# Patient Record
Sex: Female | Born: 1990
Health system: Southern US, Community
[De-identification: ages and names within clinical notes are randomized; demographics above are authoritative.]

## PROBLEM LIST (undated history)

## (undated) DIAGNOSIS — R51 Headache: Secondary | ICD-10-CM

## (undated) DIAGNOSIS — R519 Headache, unspecified: Secondary | ICD-10-CM

## (undated) DIAGNOSIS — Z8742 Personal history of other diseases of the female genital tract: Secondary | ICD-10-CM

## (undated) DIAGNOSIS — R002 Palpitations: Secondary | ICD-10-CM

## (undated) DIAGNOSIS — G5761 Lesion of plantar nerve, right lower limb: Secondary | ICD-10-CM

## (undated) DIAGNOSIS — Z309 Encounter for contraceptive management, unspecified: Secondary | ICD-10-CM

## (undated) HISTORY — DX: Personal history of other diseases of the female genital tract: Z87.42

## (undated) HISTORY — DX: Palpitations: R00.2

## (undated) HISTORY — PX: WISDOM TOOTH EXTRACTION: SHX21

## (undated) HISTORY — DX: Encounter for contraceptive management, unspecified: Z30.9

---
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
## 2012-12-16 HISTORY — PX: BREAST SURGERY: SHX581

## 2015-05-26 ENCOUNTER — Emergency Department
Admission: EM | Admit: 2015-05-26 | Discharge: 2015-05-26 | Discharge status: Absent without leave | Payer: 59 | Source: Home / Self Care

## 2015-05-26 ENCOUNTER — Encounter (HOSPITAL_BASED_OUTPATIENT_CLINIC_OR_DEPARTMENT_OTHER): Payer: Self-pay | Admitting: *Deleted

## 2015-05-26 ENCOUNTER — Emergency Department (HOSPITAL_BASED_OUTPATIENT_CLINIC_OR_DEPARTMENT_OTHER): Payer: 59

## 2015-05-26 ENCOUNTER — Emergency Department (HOSPITAL_BASED_OUTPATIENT_CLINIC_OR_DEPARTMENT_OTHER)
Admission: EM | Admit: 2015-05-26 | Discharge: 2015-05-26 | Disposition: A | Discharge status: Home / Self Care | Payer: 59 | Attending: Emergency Medicine | Admitting: Emergency Medicine

## 2015-05-26 DIAGNOSIS — Z3202 Encounter for pregnancy test, result negative: Secondary | ICD-10-CM | POA: Diagnosis not present

## 2015-05-26 DIAGNOSIS — R1031 Right lower quadrant pain: Secondary | ICD-10-CM

## 2015-05-26 DIAGNOSIS — R102 Pelvic and perineal pain: Secondary | ICD-10-CM

## 2015-05-26 DIAGNOSIS — Z8742 Personal history of other diseases of the female genital tract: Secondary | ICD-10-CM | POA: Diagnosis not present

## 2015-05-26 DIAGNOSIS — Z87442 Personal history of urinary calculi: Secondary | ICD-10-CM | POA: Diagnosis not present

## 2015-05-26 DIAGNOSIS — R109 Unspecified abdominal pain: Secondary | ICD-10-CM | POA: Diagnosis present

## 2015-05-26 LAB — URINALYSIS, ROUTINE W REFLEX MICROSCOPIC
Bilirubin Urine: NEGATIVE
GLUCOSE, UA: NEGATIVE mg/dL
Hgb urine dipstick: NEGATIVE
Ketones, ur: NEGATIVE mg/dL
Leukocytes, UA: NEGATIVE
NITRITE: NEGATIVE
PH: 6 (ref 5.0–8.0)
Protein, ur: NEGATIVE mg/dL
Specific Gravity, Urine: 1.005 (ref 1.005–1.030)
UROBILINOGEN UA: 0.2 mg/dL (ref 0.0–1.0)

## 2015-05-26 LAB — PREGNANCY, URINE: PREG TEST UR: NEGATIVE

## 2015-05-26 NOTE — ED Notes (Signed)
Pt remains in [place] dept at this time

## 2015-05-26 NOTE — ED Notes (Signed)
Pt c/o right lower abd pain x 3 days.  Nausea only

## 2015-05-26 NOTE — Discharge Instructions (Signed)
Motrin 600 mg every 6 hours as needed for pain.  Return to the emergency department if you develop worsening pain, high fever, bloody stool, or other new and concerning symptoms.   Abdominal Pain, Women Abdominal (stomach, pelvic, or belly) pain can be caused by many things. It is important to tell your doctor:  The location of the pain.  Does it come and go or is it present all the time?  Are there things that start the pain (eating certain foods, exercise)?  Are there other symptoms associated with the pain (fever, nausea, vomiting, diarrhea)? All of this is helpful to know when trying to find the cause of the pain. CAUSES   Stomach: virus or bacteria infection, or ulcer.  Intestine: appendicitis (inflamed appendix), regional ileitis (Crohn's disease), ulcerative colitis (inflamed colon), irritable bowel syndrome, diverticulitis (inflamed diverticulum of the colon), or cancer of the stomach or intestine.  Gallbladder disease or stones in the gallbladder.  Kidney disease, kidney stones, or infection.  Pancreas infection or cancer.  Fibromyalgia (pain disorder).  Diseases of the female organs:  Uterus: fibroid (non-cancerous) tumors or infection.  Fallopian tubes: infection or tubal pregnancy.  Ovary: cysts or tumors.  Pelvic adhesions (scar tissue).  Endometriosis (uterus lining tissue growing in the pelvis and on the pelvic organs).  Pelvic congestion syndrome (female organs filling up with blood just before the menstrual period).  Pain with the menstrual period.  Pain with ovulation (producing an egg).  Pain with an IUD (intrauterine device, birth control) in the uterus.  Cancer of the female organs.  Functional pain (pain not caused by a disease, may improve without treatment).  Psychological pain.  Depression. DIAGNOSIS  Your doctor will decide the seriousness of your pain by doing an examination.  Blood tests.  X-rays.  Ultrasound.  CT scan  (computed tomography, special type of X-ray).  MRI (magnetic resonance imaging).  Cultures, for infection.  Barium enema (dye inserted in the large intestine, to better view it with X-rays).  Colonoscopy (looking in intestine with a lighted tube).  Laparoscopy (minor surgery, looking in abdomen with a lighted tube).  Major abdominal exploratory surgery (looking in abdomen with a large incision). TREATMENT  The treatment will depend on the cause of the pain.   Many cases can be observed and treated at home.  Over-the-counter medicines recommended by your caregiver.  Prescription medicine.  Antibiotics, for infection.  Birth control pills, for painful periods or for ovulation pain.  Hormone treatment, for endometriosis.  Nerve blocking injections.  Physical therapy.  Antidepressants.  Counseling with a psychologist or psychiatrist.  Minor or major surgery. HOME CARE INSTRUCTIONS   Do not take laxatives, unless directed by your caregiver.  Take over-the-counter pain medicine only if ordered by your caregiver. Do not take aspirin because it can cause an upset stomach or bleeding.  Try a clear liquid diet (broth or water) as ordered by your caregiver. Slowly move to a bland diet, as tolerated, if the pain is related to the stomach or intestine.  Have a thermometer and take your temperature several times a day, and record it.  Bed rest and sleep, if it helps the pain.  Avoid sexual intercourse, if it causes pain.  Avoid stressful situations.  Keep your follow-up appointments and tests, as your caregiver orders.  If the pain does not go away with medicine or surgery, you may try:  Acupuncture.  Relaxation exercises (yoga, meditation).  Group therapy.  Counseling. SEEK MEDICAL CARE IF:   You  notice certain foods cause stomach pain.  Your home care treatment is not helping your pain.  You need stronger pain medicine.  You want your IUD removed.  You  feel faint or lightheaded.  You develop nausea and vomiting.  You develop a rash.  You are having side effects or an allergy to your medicine. SEEK IMMEDIATE MEDICAL CARE IF:   Your pain does not go away or gets worse.  You have a fever.  Your pain is felt only in portions of the abdomen. The right side could possibly be appendicitis. The left lower portion of the abdomen could be colitis or diverticulitis.  You are passing blood in your stools (bright red or black tarry stools, with or without vomiting).  You have blood in your urine.  You develop chills, with or without a fever.  You pass out. MAKE SURE YOU:   Understand these instructions.  Will watch your condition.  Will get help right away if you are not doing well or get worse. Document Released: 08/29/2007 Document Revised: 03/18/2014 Document Reviewed: 09/18/2009 [facility] Patient Information 2015 [place], [place]. This information is not intended to replace advice given to you by your health care provider. Make sure you discuss any questions you have with your health care provider.

## 2015-05-26 NOTE — ED Provider Notes (Signed)
CSN: [id number]     Arrival date & time 05/26/15  6294 History  This chart was scribed for Kelsey Speak, MD by Helane Gunther, ED Scribe. This patient was seen in room MH03/MH03 and the patient's care was started at 8:49 PM.    Chief Complaint  Patient presents with  . Abdominal Pain   Patient is a 24 y.o. female presenting with abdominal pain. The history is provided by the patient. No language interpreter was used.  Abdominal Pain Pain location:  RLQ Pain quality: sharp   Pain radiates to:  Does not radiate Pain severity:  Moderate Onset quality:  Sudden Duration:  3 days Timing:  Intermittent Progression:  Worsening Chronicity:  Recurrent Context: not trauma   Relieved by:  Nothing Worsened by:  Nothing tried Ineffective treatments:  None tried Associated symptoms: no constipation, no dysuria, no fever, no vaginal bleeding and no vaginal discharge    HPI Comments: Kelsey Brooks is a 24 y.o. female who presents to the Emergency Department complaining of intermittent, sharp, abdominal pain in her RLQ onset 3 days ago. She has a PMHx of ovarian cysts. Pt has seen her OBGYN about 2 months ago, where she underwent an internal [place], which did not show anything. She usually experinces pain in her right ovary every 2 months, but states that  this episode is much worse. She has not had a normal period in a year. She was on BCP but stopped a few weeks ago as recommended by her OBGYN. She has a PSHx of appendectomy. She denies PMHx of kidney stones. She denies trauma, injury, or falls. She also denies fever, dysuria, vaginal discharge, and constipation.  History reviewed. No pertinent past medical history. Past Surgical History  Procedure Laterality Date  . Appendectomy     History reviewed. No pertinent family history. History  Substance Use Topics  . Smoking status: Never Smoker   . Smokeless tobacco: Not on file  . Alcohol Use: No   OB History    No data available     Review of  Systems  Constitutional: Negative for fever.  Gastrointestinal: Positive for abdominal pain. Negative for constipation.  Genitourinary: Negative for dysuria, vaginal bleeding and vaginal discharge.  All other systems reviewed and are negative.     Allergies  Review of patient's allergies indicates not on file.  Home Medications   Prior to Admission medications   Not on File   BP 133/99 mmHg  Pulse 68  Temp(Src) 98.2 F (36.8 C)  Resp 16  Ht 5\' 8"  (1.727 m)  Wt 160 lb (72.576 kg)  BMI 24.33 kg/m2  SpO2 100%  LMP 05/25/2014 Physical Exam  Constitutional: She is oriented to person, place, and time. She appears well-developed and well-nourished. No distress.  HENT:  Head: Normocephalic and atraumatic.  Mouth/Throat: Oropharynx is clear and moist.  Eyes: Conjunctivae and EOM are normal. Pupils are equal, round, and reactive to light.  Neck: Normal range of motion. Neck supple. No tracheal deviation present.  Cardiovascular: Normal rate.   Pulmonary/Chest: Breath sounds normal. No respiratory distress.  Abdominal: Soft. There is tenderness. There is no rebound and no guarding.  TTP in RLQ  Musculoskeletal: Normal range of motion.  Neurological: She is alert and oriented to person, place, and time.  Skin: Skin is warm and dry.  Psychiatric: She has a normal mood and affect. Her behavior is normal.  Nursing note and vitals reviewed.   ED Course  Procedures  DIAGNOSTIC STUDIES: Oxygen Saturation is  100% on RA, normal by my interpretation.    COORDINATION OF CARE: 8:54 PM - Discussed diagnostic study results. Discussed plans to perform an [place]. Pt advised of plan for treatment and pt agrees.  Labs Review Labs Reviewed  PREGNANCY, URINE  URINALYSIS, ROUTINE W REFLEX MICROSCOPIC (NOT AT [facility])    Imaging Review No results found.   EKG Interpretation None      MDM   Final diagnoses:  None    Patient is a 24 year old female with history of what sounds like  dysmenorrhea and painful menstrual periods. She has been having these issues for several years. She began with right lower abdominal pain 3 days ago which feels similar to her previous episodes, however is somewhat worse today. She denies any fevers or chills. She denies any urinary complaints. She also has had a prior appendectomy several years ago.  Her physical examination reveals mild tenderness in the right lower quadrant with no rebound and no guarding. Her pregnancy test is negative, urinalysis is clear, and ultrasound of this area reveals no evidence for torsion or ovarian cyst. Patient denies any vaginal discharge or abnormal bleeding and declines a pelvic examination as she recently had a GYN exam 2 months ago. She will be discharged with instructions to take Motrin as needed and return to the ER if symptoms significantly worsen or change.  I personally performed the services described in this documentation, which was scribed in my presence. The recorded information has been reviewed and is accurate.      Kelsey Speak, MD 05/26/15 2236

## 2015-10-20 ENCOUNTER — Encounter: Payer: Self-pay | Admitting: Osteopathic Medicine

## 2015-10-20 ENCOUNTER — Ambulatory Visit (INDEPENDENT_AMBULATORY_CARE_PROVIDER_SITE_OTHER): Payer: 59 | Admitting: Osteopathic Medicine

## 2015-10-20 VITALS — BP 124/77 | HR 63 | Ht 68.0 in | Wt 161.0 lb

## 2015-10-20 DIAGNOSIS — Z309 Encounter for contraceptive management, unspecified: Secondary | ICD-10-CM

## 2015-10-20 DIAGNOSIS — Z8249 Family history of ischemic heart disease and other diseases of the circulatory system: Secondary | ICD-10-CM | POA: Diagnosis not present

## 2015-10-20 DIAGNOSIS — Z308 Encounter for other contraceptive management: Secondary | ICD-10-CM | POA: Diagnosis not present

## 2015-10-20 DIAGNOSIS — R002 Palpitations: Secondary | ICD-10-CM

## 2015-10-20 DIAGNOSIS — Z833 Family history of diabetes mellitus: Secondary | ICD-10-CM

## 2015-10-20 HISTORY — DX: Encounter for contraceptive management, unspecified: Z30.9

## 2015-10-20 HISTORY — DX: Palpitations: R00.2

## 2015-10-20 LAB — POCT URINE PREGNANCY: Preg Test, Ur: NEGATIVE

## 2015-10-20 MED ORDER — NORETHINDRONE-ETH ESTRADIOL 1-35 MG-MCG PO TABS: 1.0000 | ORAL_TABLET | Freq: Every day | ORAL | Status: DC

## 2015-10-20 NOTE — Patient Instructions (Signed)
You have been given an order sheet for lab work to get done to further evaluate palpitations and possible high cholesterol given your family history. We should have these results back in one or 2 days after your blood is drawn, please call us if you don't hear anything. You should also be getting a call from cardiology office about scheduling a Holter monitor placement to further evaluate for palpitations, please let us know if you don't hear about setting this up. If her palpitations get worse, if you develop chest pain, you have trouble breathing or loss of consciousness this would be a reason to go to the emergency room right away. Dr. Redgie Grayer office will call you with results of blood work, follow-up to be decided based on results. Otherwise come back in one year for annual physical/refill birth control. Come back sooner if any concerns.

## 2015-10-20 NOTE — Progress Notes (Signed)
HPI: Kelsey Brooks is a 24 y.o. female who presents to [place]  today for chief complaint of:  Chief Complaint  Patient presents with  . Establish Care    refill OCP, discuss heart palpitations   CONTRACEPTION   On OCP, doing well  IRREGULAR HEARTBEAT . Quality: "flutters:  . Severity: mild . Duration: onset maybe 2 years ago happening once every 6 months or so but now more frequent . Timing: has these about 3 times per week, random, lasts about 5 seconds or so,  . Context: (+)FH heart problems including grandparents with CVA and MI, father has irregular heartbeat, so did grandmother (grandmother needed cardiac surgery), ages 54's typically  . Modifying factors: . Assoc signs/symptoms: occasional SOB but recovers quickly.  No chest pain. Has been dizzy, no LOC   FEMALE PREVENTIVE CARE  ANNUAL SCREENING/COUNSELING Tobacco - Never  Alcohol - social drinker Diet/Exercise - HEALTHY HABITS DISCUSSED TO DECREASE CV RISK Sexual Health - Yes with female. STI - The patient denies history of sexually transmitted disease. INTERESTED IN STI TESTING - no Depression - PQH2 Negative Domestic violence concerns - no HTN SCREENING - SEE VITALS Vaccination status - SEE BELOW  INFECTIOUS DISEASE SCREENING HIV - all adults 15-65 - does not need GC/CT - sexually active - does not need TB - if risk/required by employer - does not need  DISEASE SCREENING Lipid - (Low risk screen M35/F45; High risk screen M25/F35 if HTN, Tob, FH CHD M<55/F<65) - needs DM2 (45+ or Risk = FH 1st deg DM, Hx GDM, overweight/sedentary, high-risk ethnicity, HTN) - needs  CANCER SCREENING Cervical - Pap q3 yr age 44+, Pap + HPV q5y age 19+ - PAP - does not need - had 94 and was normal  Breast - Mammo age 82+ (C) and biennial age 54-75 (A) - 65 - does not need Colon - age 17+ or 25 years of age prior to [place] Dx - GI REFERRAL - does not need  ADULT VACCINATION Influenza -  annual - was offered and declined by the patient Td booster every 10 years - was not indicated HPV - age <69yo - already has, 2 of the 3  Pneumonia - age 94+ sooner if risk (DM, smoker, other) - was not indicated   Past medical, social and family history reviewed: Past Medical History  Diagnosis Date  . Contraception management 10/20/2015    OCP   Past Surgical History  Procedure Laterality Date  . Appendectomy  2004  . Breast surgery  12/2012    BREAT AGUMENTATION   Social History  Substance Use Topics  . Smoking status: Never Smoker   . Smokeless tobacco: Not on file  . Alcohol Use: No   History reviewed. No pertinent family history.  Current Outpatient Prescriptions  Medication Sig Dispense Refill  . norethindrone-ethinyl estradiol 1/35 (ORTHO-NOVUM, NORTREL,CYCLAFEM) tablet Take 1 tablet by mouth daily.     No current facility-administered medications for this visit.   No Known Allergies    Review of Systems: CONSTITUTIONAL:  No  fever, no chills, No  unintentional weight changes HEAD/EYES/EARS/NOSE/THROAT: No headache, no vision change, no hearing change, No  sore throat CARDIAC: No chest pain, no pressure/palpitations, no orthopnea RESPIRATORY: No  cough, No  shortness of breath/wheeze GASTROINTESTINAL: No nausea, no vomiting, no abdominal pain, no blood in stool, no diarrhea, no constipation MUSCULOSKELETAL: No  myalgia/arthralgia GENITOURINARY: No incontinence, No abnormal genital bleeding/discharge SKIN: No rash/wounds/concerning lesions HEM/ONC: No easy bruising/bleeding, no  abnormal lymph node ENDOCRINE: No polyuria/polydipsia/polyphagia, no heat/cold intolerance  NEUROLOGIC: No weakness, no dizziness, no slurred speech PSYCHIATRIC: No concerns with depression, no concerns with anxiety, no sleep problems    Exam:  BP 124/77 mmHg  Pulse 63  Ht 5\' 8"  (1.727 m)  Wt 161 lb (73.029 kg)  BMI 24.49 kg/m2 Constitutional: VSS, see above. General Appearance:  alert, well-developed, well-nourished, NAD Eyes: Normal lids and conjunctive, non-icteric sclera, PERRLA Ears, Nose, Mouth, Throat: Normal external inspection ears/nares/mouth/lips/gums, TM normal bilaterally, MMM, posterior pharynx No  erythema No  exudate Neck: No masses, trachea midline. No thyroid enlargement/tenderness/mass appreciated. No lymphadenopathy Respiratory: Normal respiratory effort. no wheeze, no rhonchi, no rales Cardiovascular: S1/S2 normal, no murmur, no rub/gallop auscultated. RRR.  No carotid bruit or JVD. No abdominal aortic bruit.  Pedal pulse II/IV bilaterally DP and PT.  No lower extremity edema. Gastrointestinal: Nontender, no masses. No hepatomegaly, no splenomegaly. No hernia appreciated. Bowel sounds normal. Rectal exam deferred.  Musculoskeletal: Gait normal. No clubbing/cyanosis of digits.  Neurological: No cranial nerve deficit on limited exam. Motor and sensation intact and symmetric Psychiatric: Normal judgment/insight. Normal mood and affect. Oriented x3.    Results for orders placed or performed in visit on 10/20/15 (from the past 72 hour(s))  POCT urine pregnancy     Status: None   Collection Time: 10/20/15  8:43 AM  Result Value Ref Range   Preg Test, Ur Negative Negative    EKG interpretation: Rate: 63 Rhythm: sinus Axis: normal No ST/T changes concerning for acute ischemia/infarct  Normal PR interval, no Delta wave, no J wave ?RBBB V1, V2   ASSESSMENT/PLAN: W/u for palpitations given abnormal E will KG, increased frequency. Refill OCP. Screening as below, recommend find immunization records re Tetanus and HPV, repeat Pap due 2018. ER precautions reviewed, the patient instructions.   Palpitations - Plan: CBC with Differential/Platelet, COMPLETE METABOLIC PANEL WITH GFR, TSH, Magnesium, Holter monitor - 48 hour  Encounter for other contraceptive management - Plan: POCT urine pregnancy  Family history of heart disease - Plan: Lipid  panel  Family history of diabetes mellitus   Return TBD based on results, as needed for any concerns.

## 2015-10-21 LAB — CBC WITH DIFFERENTIAL/PLATELET
Basophils Absolute: 0 10*3/uL (ref 0.0–0.1)
Basophils Relative: 0 % (ref 0–1)
Eosinophils Absolute: 0 10*3/uL (ref 0.0–0.7)
Eosinophils Relative: 1 % (ref 0–5)
HEMATOCRIT: 41.8 % (ref 36.0–46.0)
HEMOGLOBIN: 14.3 g/dL (ref 12.0–15.0)
LYMPHS ABS: 2.1 10*3/uL (ref 0.7–4.0)
Lymphocytes Relative: 43 % (ref 12–46)
MCH: 30.7 pg (ref 26.0–34.0)
MCHC: 34.2 g/dL (ref 30.0–36.0)
MCV: 89.7 fL (ref 78.0–100.0)
MONOS PCT: 6 % (ref 3–12)
MPV: 10.3 fL (ref 8.6–12.4)
Monocytes Absolute: 0.3 10*3/uL (ref 0.1–1.0)
NEUTROS PCT: 50 % (ref 43–77)
Neutro Abs: 2.5 10*3/uL (ref 1.7–7.7)
Platelets: 229 10*3/uL (ref 150–400)
RBC: 4.66 MIL/uL (ref 3.87–5.11)
RDW: 12.9 % (ref 11.5–15.5)
WBC: 4.9 10*3/uL (ref 4.0–10.5)

## 2015-10-21 LAB — COMPLETE METABOLIC PANEL WITH GFR
ALBUMIN: 4 g/dL (ref 3.6–5.1)
ALK PHOS: 48 U/L (ref 33–115)
ALT: 11 U/L (ref 6–29)
AST: 17 U/L (ref 10–30)
BUN: 13 mg/dL (ref 7–25)
CO2: 26 mmol/L (ref 20–31)
Calcium: 8.5 mg/dL — ABNORMAL LOW (ref 8.6–10.2)
Chloride: 104 mmol/L (ref 98–110)
Creat: 0.9 mg/dL (ref 0.50–1.10)
GFR, Est African American: >89 mL/min (ref >=60)
GFR, Est Non African American: 89 mL/min (ref >=60)
Glucose, Bld: 81 mg/dL (ref 65–99)
POTASSIUM: 3.8 mmol/L (ref 3.5–5.3)
Sodium: 139 mmol/L (ref 135–146)
TOTAL PROTEIN: 6.4 g/dL (ref 6.1–8.1)
Total Bilirubin: 0.7 mg/dL (ref 0.2–1.2)

## 2015-10-21 LAB — LIPID PANEL
CHOL/HDL RATIO: 2.5 ratio (ref <=5.0)
CHOLESTEROL: 164 mg/dL (ref 125–200)
HDL: 66 mg/dL (ref >=46)
LDL Cholesterol: 82 mg/dL (ref <130)
Triglycerides: 81 mg/dL (ref <150)
VLDL: 16 mg/dL (ref <30)

## 2015-10-21 LAB — MAGNESIUM: Magnesium: 2 mg/dL (ref 1.5–2.5)

## 2015-10-21 LAB — TSH: TSH: 2.741 u[IU]/mL (ref 0.350–4.500)

## 2015-10-22 ENCOUNTER — Other Ambulatory Visit: Payer: Self-pay

## 2015-10-22 DIAGNOSIS — R002 Palpitations: Secondary | ICD-10-CM

## 2015-10-23 NOTE — Addendum Note (Signed)
Addended by: Huel Cote on: 10/23/2015 04:06 PM   Modules accepted: Orders

## 2015-10-24 ENCOUNTER — Encounter: Payer: Self-pay | Admitting: Osteopathic Medicine

## 2015-10-24 DIAGNOSIS — Z8742 Personal history of other diseases of the female genital tract: Secondary | ICD-10-CM

## 2015-10-24 HISTORY — DX: Personal history of other diseases of the female genital tract: Z87.42

## 2015-10-31 ENCOUNTER — Ambulatory Visit (INDEPENDENT_AMBULATORY_CARE_PROVIDER_SITE_OTHER): Payer: 59

## 2015-10-31 DIAGNOSIS — R002 Palpitations: Secondary | ICD-10-CM

## 2016-01-26 ENCOUNTER — Telehealth: Payer: Self-pay

## 2016-01-26 DIAGNOSIS — Z3041 Encounter for surveillance of contraceptive pills: Secondary | ICD-10-CM

## 2016-01-26 MED ORDER — NORETHINDRONE ACET-ETHINYL EST 1-20 MG-MCG PO TABS: 1.0000 | ORAL_TABLET | Freq: Every day | ORAL | Status: DC

## 2016-01-26 NOTE — Telephone Encounter (Signed)
Patient called stated that the pharmacy no longer carry the  norethindrone-ethinyl estradiol 1/35 (ORTHO-NOVUM, NORTREL,CYCLAFEM) tablet   1 tablet, Daily 12 ordered  ReorderDiscontinue      . She would like to know if there is something else you can send to her pharmacy. Please advise that patient is out of the medication. Zakhi Dupre,CMA

## 2016-01-27 NOTE — Telephone Encounter (Signed)
Left detailed message on patient vm advising that alternative birth control was sent to pharmacy. Rhonda Cunningham,CMA

## 2016-01-27 NOTE — Telephone Encounter (Signed)
Sent alternative

## 2016-03-26 ENCOUNTER — Encounter: Payer: Self-pay | Admitting: Emergency Medicine

## 2016-03-26 ENCOUNTER — Emergency Department
Admission: EM | Admit: 2016-03-26 | Discharge: 2016-03-26 | Disposition: A | Discharge status: Home / Self Care | Payer: 59 | Source: Home / Self Care | Attending: Emergency Medicine | Admitting: Emergency Medicine

## 2016-03-26 DIAGNOSIS — J029 Acute pharyngitis, unspecified: Secondary | ICD-10-CM | POA: Diagnosis not present

## 2016-03-26 LAB — POCT RAPID STREP A (OFFICE): Rapid Strep A Screen: NEGATIVE

## 2016-03-26 MED ORDER — AMOXICILLIN 500 MG PO CAPS: 500.0000 mg | ORAL_CAPSULE | Freq: Two times a day (BID) | ORAL | Status: DC

## 2016-03-26 NOTE — ED Notes (Signed)
Sore throat, fatigue x 1 week

## 2016-03-26 NOTE — ED Provider Notes (Signed)
CSN: [id number]     Arrival date & time 03/26/16  [id number] History   First MD Initiated Contact with Patient 03/26/16 [phone]     Chief Complaint  Patient presents with  . Sore Throat    HPI SORE THROAT Onset: 6 days    Severity: moderate-Severe Tried OTC meds without significant relief. She is leaving for 1 week cruise tonight and requests antibiotic treatment if possible  Symptoms:  May have had low-grade fever, she is not sure though   No Recent Strep Exposure     No Myalgias No Headache No Rash  No Discolored Nasal Mucus Mild seasonal Allergy symptoms, minimal clear postnasal drainage. Mild to moderate watery itchy eyes at times. No sinus pain/pressure No earache  No Drooling No Trismus  No Nausea No Vomiting No Abdominal pain No Diarrhea No Reflux symptoms  No Cough No Breathing Difficulty No Shortness of Breath No pleuritic pain No Wheezing No Hemoptysis  She denies chance of pregnancy Past Medical History  Diagnosis Date  . Contraception management 10/20/2015    OCP  . Palpitations 10/20/2015  . History of ovarian cyst 10/24/2015   Past Surgical History  Procedure Laterality Date  . Appendectomy  2004  . Breast surgery  12/2012    BREAT AGUMENTATION   History reviewed. No pertinent family history. Social History  Substance Use Topics  . Smoking status: Never Smoker   . Smokeless tobacco: None  . Alcohol Use: No   OB History    No data available     Review of Systems  All other systems reviewed and are negative.  see history of present illness  Allergies  Review of patient's allergies indicates no known allergies.  Home Medications   Prior to Admission medications   Medication Sig Start Date End Date Taking? Authorizing Provider  amoxicillin (AMOXIL) 500 MG capsule Take 1 capsule (500 mg total) by mouth 2 (two) times daily. 03/26/16   Jacqulyn Cane, MD  norethindrone-ethinyl estradiol (MICROGESTIN,JUNEL,LOESTRIN) 1-20 MG-MCG tablet Take 1  tablet by mouth daily. 01/26/16   Emeterio Reeve, DO   Meds Ordered and Administered this Visit  Medications - No data to display  BP 120/85 mmHg  Pulse 67  Temp(Src) 97.9 F (36.6 C) (Oral)  Ht 5\' 8"  (1.727 m)  Wt 163 lb (73.936 kg)  BMI 24.79 kg/m2  SpO2 100%  LMP 03/15/2016 No data found.   Physical Exam  Constitutional: She is oriented to person, place, and time. She appears well-developed and well-nourished. She is cooperative.  Non-toxic appearance. No distress.  HENT:  Head: Normocephalic and atraumatic.  Right Ear: Tympanic membrane, external ear and ear canal normal.  Left Ear: Tympanic membrane, external ear and ear canal normal.  Nose: Right sinus exhibits no maxillary sinus tenderness and no frontal sinus tenderness. Left sinus exhibits no maxillary sinus tenderness and no frontal sinus tenderness.  Mouth/Throat: Mucous membranes are normal. Posterior oropharyngeal erythema present. No oropharyngeal exudate or posterior oropharyngeal edema.  Nose: Minimally boggy turbinates, minimal serous drainage. Pharynx: Very injected without tonsillar enlargement or exudate. Minimal cobblestoning posterior pharynx  Eyes: Conjunctivae are normal. No scleral icterus.  Neck: Neck supple.  Neck supple, minimal shotty, nontender anterior cervical nodes. No posterior cervical nodes  Cardiovascular: Normal rate, regular rhythm and normal heart sounds.   No murmur heard. Pulmonary/Chest: Effort normal and breath sounds normal. No stridor. No respiratory distress. She has no wheezes. She has no rales.  Abdominal: She exhibits no distension.  Musculoskeletal: She exhibits no  edema.  Lymphadenopathy:    She has cervical adenopathy.       Right cervical: Superficial cervical adenopathy present. No deep cervical and no posterior cervical adenopathy present.      Left cervical: Superficial cervical adenopathy present. No deep cervical and no posterior cervical adenopathy present.   Neurological: She is alert and oriented to person, place, and time.  Skin: Skin is warm and dry. No rash noted.  Psychiatric: She has a normal mood and affect.  Nursing note and vitals reviewed.   ED Course  Procedures (including critical care time)  Labs Review Labs Reviewed  POCT RAPID STREP A (OFFICE)    Imaging Review No results found.   Visual Acuity Review  Right Eye Distance:   Left Eye Distance:   Bilateral Distance:    Right Eye Near:   Left Eye Near:    Bilateral Near:         MDM Rapid strep test negative. She declined sending off strep culture.  I explained it's possible she could have a false negative rapid strep test, but it more likely is viral pharyngitis and/or seasonal allergies as a cause for her symptoms.    1. Acute pharyngitis, unspecified etiology    Risks, benefits, alternatives discussed. She prefers filling the antibiotic and taking it with her on the cruise-leaving for the cruise this evening for 1 week. She'll take the antibiotic if worsening symptoms or fever. amoxicillin (AMOXIL) 500 MG capsule Take 1 capsule (500 mg total) by mouth 2 (two) times daily. 20 capsule   Follow-up with your primary care doctor in 7 days if not improving, or sooner if symptoms become worse. Precautions discussed. Red flags discussed. Questions invited and answered. Patient voiced understanding and agreement.     Jacqulyn Cane, MD 03/26/16 [phone]

## 2016-12-23 DIAGNOSIS — H5213 Myopia, bilateral: Secondary | ICD-10-CM | POA: Diagnosis not present

## 2016-12-23 DIAGNOSIS — H527 Unspecified disorder of refraction: Secondary | ICD-10-CM | POA: Diagnosis not present

## 2017-01-12 ENCOUNTER — Ambulatory Visit (INDEPENDENT_AMBULATORY_CARE_PROVIDER_SITE_OTHER): Payer: 59 | Admitting: Osteopathic Medicine

## 2017-01-12 ENCOUNTER — Ambulatory Visit (INDEPENDENT_AMBULATORY_CARE_PROVIDER_SITE_OTHER): Payer: 59

## 2017-01-12 ENCOUNTER — Telehealth: Payer: Self-pay | Admitting: Osteopathic Medicine

## 2017-01-12 ENCOUNTER — Encounter: Payer: Self-pay | Admitting: Osteopathic Medicine

## 2017-01-12 VITALS — BP 128/83 | HR 73 | Ht 68.0 in | Wt 169.0 lb

## 2017-01-12 DIAGNOSIS — M79671 Pain in right foot: Secondary | ICD-10-CM

## 2017-01-12 DIAGNOSIS — G8929 Other chronic pain: Secondary | ICD-10-CM | POA: Diagnosis not present

## 2017-01-12 DIAGNOSIS — Z3041 Encounter for surveillance of contraceptive pills: Secondary | ICD-10-CM | POA: Diagnosis not present

## 2017-01-12 DIAGNOSIS — M7751 Other enthesopathy of right foot: Secondary | ICD-10-CM

## 2017-01-12 DIAGNOSIS — M7741 Metatarsalgia, right foot: Secondary | ICD-10-CM

## 2017-01-12 MED ORDER — NORETHINDRONE ACET-ETHINYL EST 1-20 MG-MCG PO TABS: 1.0000 | ORAL_TABLET | Freq: Every day | ORAL | 11 refills | Status: DC

## 2017-01-12 MED ORDER — MELOXICAM 7.5 MG PO TABS: 7.5000 mg | ORAL_TABLET | Freq: Every day | ORAL | 2 refills | Status: DC

## 2017-01-12 NOTE — Telephone Encounter (Signed)
Please call patient: No concerns seen on the x-ray

## 2017-01-12 NOTE — Progress Notes (Signed)
HPI: Kelsey Brooks is a 26 y.o. female  who presents to [facility] today, 01/12/17,  for chief complaint of:  Chief Complaint  Patient presents with  . Follow-up    BIRTH CONTROL  . Toe Pain    RIGHT FOOT 2ND TOE    Refill birth control: no plans for pregnancy anytime soon but possibly in the next 1-2 years  Foot pain . Context: dancer and teaches dance . Location: R 2nd toe at the base (MTP/PIP) . Quality: sore, gets swollen and occasionally bruises . Duration: ongoing for few years but getting more frequent . Timing: once or twice per month, lasts 6-8 hours when it happens . Modifying factors: resolves with rest and ice . Assoc signs/symptoms: no numbness or fall  Palpitations: infrequent now, occur with stress, pt not concerned about needing further w/u    Past medical history, surgical history, social history and family history reviewed.  Patient Active Problem List   Diagnosis Date Noted  . History of ovarian cyst 10/24/2015  . Contraception management 10/20/2015  . Palpitations 10/20/2015    Current medication list and allergy/intolerance information reviewed.   Current Outpatient Prescriptions on File Prior to Visit  Medication Sig Dispense Refill  . norethindrone-ethinyl estradiol (MICROGESTIN,JUNEL,LOESTRIN) 1-20 MG-MCG tablet Take 1 tablet by mouth daily. 1 Package 11   No current facility-administered medications on file prior to visit.    No Known Allergies    Review of Systems:  Constitutional: No recent illness  HEENT: No  headache, no vision change  Cardiac: No  chest pain, No  pressure, +occasional stress-related palpitations  Respiratory:  No  shortness of breath. No  Cough  Musculoskeletal: + myalgia/arthralgia  Skin: No  Rash  Psychiatric: No  concerns with depression, No  concerns with anxiety  Exam:  BP 128/83   Pulse 73   Ht 5\' 8"  (1.727 m)   Wt 169 lb (76.7 kg)   BMI 25.70 kg/m    Constitutional: VS see above. General Appearance: alert, well-developed, well-nourished, NAD  Eyes: Normal lids and conjunctive, non-icteric sclera  Ears, Nose, Mouth, Throat: MMM, Normal external inspection ears/nares/mouth/lips/gums.  Neck: No masses, trachea midline.   Respiratory: Normal respiratory effort. no wheeze, no rhonchi, no rales  Cardiovascular: S1/S2 normal, no murmur, no rub/gallop auscultated. RRR.   Musculoskeletal: Gait normal. Symmetric and independent movement of all extremities. R 2nd toe deviated medially at the DIP, no swelling/eccymoses, normal passive ROM and nontender   Neurological: Normal balance/coordination. No tremor.  Skin: warm, dry, intact.   Psychiatric: Normal judgment/insight. Normal mood and affect. Oriented x3.    Dg Foot Complete Right  Result Date: 01/12/2017 CLINICAL DATA:  Chronic right foot pain without known injury. EXAM: RIGHT FOOT COMPLETE - 3+ VIEW COMPARISON:  None. FINDINGS: There is no evidence of fracture or dislocation. There is no evidence of arthropathy or other focal bone abnormality. Soft tissues are unremarkable. IMPRESSION: Normal right foot. Electronically Signed   By: Marijo Conception, M.D.   On: 01/12/2017 09:17   XR personally reviewed     ASSESSMENT/PLAN:   Encounter for surveillance of contraceptive pills - Plan: norethindrone-ethinyl estradiol (MICROGESTIN,JUNEL,LOESTRIN) 1-20 MG-MCG tablet  Metatarsalgia of right foot - Plan: meloxicam (MOBIC) 7.5 MG tablet, DG Foot Complete Right  Tendinitis of right foot - Plan: DG Foot Complete Right    Patient Instructions  Plan:  When foot is bothering you, rest, ice, elevation, antiinflammatories  If continues or gets worse, recommend referral to  podiatry or sports medicine in this office (Dr. Georgina Snell or Dr. Darene Lamer)     Follow-up plan: Return for foot pain as needed.  Visit summary with medication list and pertinent instructions was printed for patient to review,  alert [place] if any changes needed. All questions at time of visit were answered - patient instructed to contact office with any additional concerns. ER/RTC precautions were reviewed with the patient and understanding verbalized.

## 2017-01-12 NOTE — Patient Instructions (Signed)
Plan:  When foot is bothering you, rest, ice, elevation, antiinflammatories  If continues or gets worse, recommend referral to podiatry or sports medicine in this office (Dr. Georgina Snell or Dr. Darene Lamer)

## 2017-01-13 NOTE — Telephone Encounter (Signed)
Patient has been informed. Zaydrian Batta,CMA  

## 2017-02-15 ENCOUNTER — Encounter: Payer: Self-pay | Admitting: *Deleted

## 2017-02-15 ENCOUNTER — Emergency Department (INDEPENDENT_AMBULATORY_CARE_PROVIDER_SITE_OTHER)
Admission: EM | Admit: 2017-02-15 | Discharge: 2017-02-15 | Disposition: A | Discharge status: Home / Self Care | Payer: 59 | Source: Home / Self Care | Attending: Family Medicine | Admitting: Family Medicine

## 2017-02-15 DIAGNOSIS — J019 Acute sinusitis, unspecified: Secondary | ICD-10-CM

## 2017-02-15 DIAGNOSIS — B9689 Other specified bacterial agents as the cause of diseases classified elsewhere: Secondary | ICD-10-CM

## 2017-02-15 DIAGNOSIS — J069 Acute upper respiratory infection, unspecified: Secondary | ICD-10-CM

## 2017-02-15 MED ORDER — BENZONATATE 100 MG PO CAPS: 100.0000 mg | ORAL_CAPSULE | Freq: Three times a day (TID) | ORAL | 0 refills | Status: DC

## 2017-02-15 MED ORDER — AMOXICILLIN-POT CLAVULANATE 875-125 MG PO TABS
1.0000 | ORAL_TABLET | Freq: Two times a day (BID) | ORAL | 0 refills | Status: DC
Start: 2017-02-15 — End: 2017-05-25

## 2017-02-15 NOTE — ED Triage Notes (Signed)
Patient c/o 6 days of productive cough, sore throat, hoarse,  Then later chills and hot flashes, HA. Taken Mucinex cough, Sudafed and IBF.

## 2017-02-15 NOTE — ED Provider Notes (Signed)
CSN: [id number]     Arrival date & time 02/15/17  1054 History   First MD Initiated Contact with Patient 02/15/17 1114     Chief Complaint  Patient presents with  . Cough  . Sore Throat   (Consider location/radiation/quality/duration/timing/severity/associated sxs/prior Treatment) HPI  Elianys Conry is a 26 y.o. female presenting to [facility] with c/o  6 days of worsening productive cough, sore throat, hoarse voice, and frontal headache with sinus pain and pressure.  She also reports intermittent chills and hot flashes.  She has taken Mucinex, Sudafed and ibuprofen with mild temporary relief.   Past Medical History:  Diagnosis Date  . Contraception management 10/20/2015   OCP  . History of ovarian cyst 10/24/2015  . Palpitations 10/20/2015   Past Surgical History:  Procedure Laterality Date  . APPENDECTOMY  2004  . BREAST SURGERY  12/2012   BREAT AGUMENTATION   History reviewed. No pertinent family history. Social History  Substance Use Topics  . Smoking status: Never Smoker  . Smokeless tobacco: Never Used  . Alcohol use No   OB History    No data available     Review of Systems  Constitutional: Positive for chills and diaphoresis. Negative for fever.  HENT: Positive for congestion, ear pain, rhinorrhea, sinus pain, sinus pressure, sore throat and voice change. Negative for trouble swallowing.   Respiratory: Positive for cough. Negative for shortness of breath.   Cardiovascular: Negative for chest pain and palpitations.  Gastrointestinal: Negative for abdominal pain, diarrhea, nausea and vomiting.  Musculoskeletal: Negative for arthralgias, back pain and myalgias.  Skin: Negative for rash.  Neurological: Positive for headaches. Negative for dizziness and light-headedness.    Allergies  Patient has no known allergies.  Home Medications   Prior to Admission medications   Medication Sig Start Date End Date Taking? Authorizing Provider  amoxicillin-clavulanate (AUGMENTIN)  875-125 MG tablet Take 1 tablet by mouth 2 (two) times daily. One po bid x 7 days 02/15/17   Noland Fordyce, PA-C  benzonatate (TESSALON) 100 MG capsule Take 1-2 capsules (100-200 mg total) by mouth every 8 (eight) hours. 02/15/17   Noland Fordyce, PA-C  meloxicam (MOBIC) 7.5 MG tablet Take 1-2 tablets (7.5-15 mg total) by mouth daily. 01/12/17   Emeterio Reeve, DO  norethindrone-ethinyl estradiol (MICROGESTIN,JUNEL,LOESTRIN) 1-20 MG-MCG tablet Take 1 tablet by mouth daily. 01/12/17   Emeterio Reeve, DO   Meds Ordered and Administered this Visit  Medications - No data to display  BP 133/87 (BP Location: Left Arm)   Pulse 99   Temp 97.6 F (36.4 C) (Oral)   Resp 14   Wt 166 lb (75.3 kg)   LMP 01/15/2017 (Approximate)   SpO2 98%   BMI 25.24 kg/m  No data found.   Physical Exam  Constitutional: She is oriented to person, place, and time. She appears well-developed and well-nourished. No distress.  HENT:  Head: Normocephalic and atraumatic.  Right Ear: Tympanic membrane normal.  Left Ear: Tympanic membrane normal.  Nose: Mucosal edema present. Right sinus exhibits maxillary sinus tenderness and frontal sinus tenderness. Left sinus exhibits maxillary sinus tenderness and frontal sinus tenderness.  Mouth/Throat: Uvula is midline, oropharynx is clear and moist and mucous membranes are normal.  Eyes: EOM are normal.  Neck: Normal range of motion. Neck supple.  Cardiovascular: Normal rate and regular rhythm.   Pulmonary/Chest: Effort normal and breath sounds normal. No stridor. No respiratory distress. She has no wheezes. She has no rales.  Musculoskeletal: Normal range of motion.  Lymphadenopathy:  She has no cervical adenopathy.  Neurological: She is alert and oriented to person, place, and time.  Skin: Skin is warm and dry. She is not diaphoretic.  Psychiatric: She has a normal mood and affect. Her behavior is normal.  Nursing note and vitals reviewed.   Urgent Care Course      Procedures (including critical care time)  Labs Review Labs Reviewed - No data to display  Imaging Review No results found.  MDM   1. Acute bacterial sinusitis   2. Upper respiratory tract infection, unspecified type    Hx and exam c/w bacterial sinusitis and URI  Rx: Augmentin and tessalon May continue mucinex, acetaminophen and ibuprofen.  f/u with PCP in 1 week if not improving.    Noland Fordyce, PA-C 02/15/17 1149

## 2017-03-09 ENCOUNTER — Telehealth: Payer: Self-pay

## 2017-03-09 DIAGNOSIS — M79673 Pain in unspecified foot: Secondary | ICD-10-CM

## 2017-03-09 NOTE — Telephone Encounter (Signed)
Patient called stated that she is having foot issues and she request a referral for a Podiatrist. Please advise. Rhonda Cunningham,CMA

## 2017-03-09 NOTE — Telephone Encounter (Signed)
Message left on vm 

## 2017-03-09 NOTE — Telephone Encounter (Signed)
Please call patient: Referral has been ordered.

## 2017-03-14 ENCOUNTER — Ambulatory Visit (INDEPENDENT_AMBULATORY_CARE_PROVIDER_SITE_OTHER): Payer: 59 | Admitting: Podiatry

## 2017-03-14 DIAGNOSIS — G5761 Lesion of plantar nerve, right lower limb: Secondary | ICD-10-CM | POA: Diagnosis not present

## 2017-03-14 DIAGNOSIS — M21969 Unspecified acquired deformity of unspecified lower leg: Secondary | ICD-10-CM

## 2017-03-14 DIAGNOSIS — M2010 Hallux valgus (acquired), unspecified foot: Secondary | ICD-10-CM | POA: Diagnosis not present

## 2017-03-14 NOTE — Patient Instructions (Signed)
Seen for pain in right foot. Possible Neuroma at 2nd intermetatarsal space right. Also has Hallux valgus with bunion deformity bilateral. Metatarsal binder x 2 dispensed. Reviewed possible surgical option. Need custom orthotics.

## 2017-03-14 NOTE — Progress Notes (Signed)
SUBJECTIVE: 26 y.o. year old female presents complaining of pain in right foot over 3 years. Noted of popping toe with bruise and pain weekly.  REVIEW OF SYSTEMS: Pertinent items noted in HPI and remainder of comprehensive ROS otherwise negative.  OBJECTIVE: DERMATOLOGIC EXAMINATION: No abnormal skin lesions.  VASCULAR EXAMINATION OF LOWER LIMBS: All pedal pulses are palpable with normal pulsation.  Capillary Filling times within 3 seconds in all digits.  No edema or erythema noted. Temperature gradient from tibial crest to dorsum of foot is within normal bilateral.  NEUROLOGIC EXAMINATION OF THE LOWER LIMBS: All epicritic and tactile sensations grossly intact. Sharp and Dull discriminatory sensations at the plantar ball of hallux is intact bilateral.   MUSCULOSKELETAL EXAMINATION: Positive for pain under 2nd intermetatarsal space with direct and lateral pressure. Occasional feeling of popping with pain. Excess bunion deformity with hypermobility of the first ray bilateral.  ASSESSMENT: Morton's neuroma 2nd interspace right. HAV with bunion bilateral. Hypermobile first ray bilateral.  PLAN: Reviewed findings and available treatment options, injection, orthotics, surgical. Metatarsal binder x 1 pair large dispensed to add stability of the first ray. Patient will return for Orthotics or for pre op.

## 2017-03-15 ENCOUNTER — Encounter: Payer: Self-pay | Admitting: Podiatry

## 2017-03-18 ENCOUNTER — Encounter (INDEPENDENT_AMBULATORY_CARE_PROVIDER_SITE_OTHER): Payer: Self-pay | Admitting: Orthopedic Surgery

## 2017-03-18 ENCOUNTER — Ambulatory Visit (INDEPENDENT_AMBULATORY_CARE_PROVIDER_SITE_OTHER): Payer: 59 | Admitting: Orthopedic Surgery

## 2017-03-18 VITALS — Ht 68.0 in | Wt 166.0 lb

## 2017-03-18 DIAGNOSIS — G5761 Lesion of plantar nerve, right lower limb: Secondary | ICD-10-CM | POA: Diagnosis not present

## 2017-03-18 MED ORDER — LIDOCAINE HCL 1 % IJ SOLN
1.0000 mL | INTRAMUSCULAR | Status: AC | PRN
  Administered 2017-03-18: 1 mL

## 2017-03-18 MED ORDER — METHYLPREDNISOLONE ACETATE 40 MG/ML IJ SUSP
40.0000 mg | INTRAMUSCULAR | Status: AC | PRN
  Administered 2017-03-18: 40 mg via INTRA_ARTICULAR

## 2017-03-18 NOTE — Progress Notes (Signed)
Office Visit Note   Patient: Kelsey Brooks           Date of Birth: January 18, 1991           MRN: [id number] Visit Date: 03/18/2017              Requested by: Emeterio Reeve, DO [place] [address] [place],  [id number] PCP: Emeterio Reeve, DO  Chief Complaint  Patient presents with  . Right Foot - Pain      HPI: Patient is a 26 year old woman who has had a painful Morton's neuroma for the past 3 years. Patient to use as she did see a podiatrist recently who recommended no injections but just removal of neuroma. 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 She states she has popping in the third webspace with running and burning radicular pain radiating up her foot.  Assessment & Plan: Visit Diagnoses:  1. Morton neuroma, right     Plan: After informed consent the Morton seroma was injected. She tolerated this well follow-up as needed. Discussed that we could proceed with up to 3 shots. Discussed that she still has mechanical popping in the third webspace that a resection would be the best option. Recommended wider running shoes.  Follow-Up Instructions: Return if symptoms worsen or fail to improve.   Ortho Exam  Patient is alert, oriented, no adenopathy, well-dressed, normal affect, normal respiratory effort. Patient has a normal gait. She has good pulses good ankle and subtalar motion. She has no tenderness to palpation over the metatarsal heads she is point tender to palpation of the third web space. Lateral compression of the metatarsal heads reproduces a clunk.  Imaging: No results found.  Labs: No results found for: HGBA1C, ESRSEDRATE, CRP, LABURIC, REPTSTATUS, GRAMSTAIN, CULT, LABORGA  Orders:  No orders of the defined types were placed in this encounter.  No orders of the defined types were placed in this encounter.    Procedures: Small Joint Inj Date/Time: 03/18/2017 9:18 AM Performed by: DUDA, MARCUS V Authorized by: Newt Minion   Consent Given by:  Patient Site marked: the procedure site was marked   Timeout: prior to procedure the correct patient, procedure, and site was verified   Indications:  Pain and diagnostic evaluation Location:  Foot Site:  R intertarsal Prep: patient was prepped and draped in usual sterile fashion   Needle Size:  22 G Spinal Needle: No   Approach:  Dorsal Ultrasound Guided: No   Fluoroscopic Guidance: No   Medications:  1 mL lidocaine 1 %; 40 mg methylPREDNISolone acetate 40 MG/ML Aspiration Attempted: No   Patient tolerance:  Patient tolerated the procedure well with no immediate complications    Clinical Data: No additional findings.  ROS:  All other systems negative, except as noted in the HPI. Review of Systems  Objective: Vital Signs: Ht 5\' 8"  (1.727 m)   Wt 166 lb (75.3 kg)   BMI 25.24 kg/m   Specialty Comments:  No specialty comments available.  PMFS History: Patient Active Problem List   Diagnosis Date Noted  . Morton neuroma, right 03/18/2017  . Metatarsalgia of right foot 01/12/2017  . History of ovarian cyst 10/24/2015  . Contraception management 10/20/2015  . Palpitations 10/20/2015   Past Medical History:  Diagnosis Date  . Contraception management 10/20/2015   OCP  . History of ovarian cyst 10/24/2015  . Palpitations 10/20/2015    No family history on file.  Past Surgical History:  Procedure Laterality Date  . APPENDECTOMY  2004  . BREAST SURGERY  12/2012   BREAT AGUMENTATION   Social History   Occupational History  . Not on file.   Social History Main Topics  . Smoking status: Never Smoker  . Smokeless tobacco: Never Used  . Alcohol use No  . Drug use: No  . Sexual activity: No

## 2017-05-12 ENCOUNTER — Telehealth (INDEPENDENT_AMBULATORY_CARE_PROVIDER_SITE_OTHER): Payer: Self-pay | Admitting: Orthopedic Surgery

## 2017-05-12 NOTE — Telephone Encounter (Signed)
Kelsey Brooks came to see Dr. Sharol Given back in May for a Morton's Neuroma on her Right Foot.  She had an injection at that appointment.  She called today to advise that the injection did not help and she would like to proceed with surgery.  She has [employer] and would need to be scheduled at a [facility] facility. She is available anytime after next week.

## 2017-05-16 NOTE — Telephone Encounter (Signed)
Blue sheet completed.

## 2017-05-20 ENCOUNTER — Ambulatory Visit (INDEPENDENT_AMBULATORY_CARE_PROVIDER_SITE_OTHER): Payer: 59 | Admitting: Orthopedic Surgery

## 2017-05-20 ENCOUNTER — Encounter (INDEPENDENT_AMBULATORY_CARE_PROVIDER_SITE_OTHER): Payer: Self-pay | Admitting: Orthopedic Surgery

## 2017-05-20 VITALS — Ht 68.0 in | Wt 166.0 lb

## 2017-05-20 DIAGNOSIS — G5761 Lesion of plantar nerve, right lower limb: Secondary | ICD-10-CM | POA: Diagnosis not present

## 2017-05-20 NOTE — Progress Notes (Signed)
   Office Visit Note   Patient: Kelsey Brooks           Date of Birth: 1990/12/24           MRN: [id number] Visit Date: 05/20/2017              Requested by: Emeterio Reeve, [address] [place], [place] [id number] PCP: Emeterio Reeve, DO  Chief Complaint  Patient presents with  . Right Foot - Follow-up    S/p mortons neuroma injection 03/18/17      HPI: Patient presents in follow-up for her Morton's neuroma right foot third web space. Patient had an injection approximately 2 months ago. Patient states that she did have good relief for about a month. Patient states that now with activities of daily living with cycling with dancing she has a popping in the third webspace. She has pain with activities of daily living.  Assessment & Plan: Visit Diagnoses:  1. Morton neuroma, right     Plan: Patient states she would like to proceed with excision of the Morton's neuroma risk and benefits were discussed including infection neuroma pain nonhealing of the wound need for additional surgery. Patient states she understands wish to proceed at this time.  Follow-Up Instructions: Return in about 1 week (around 05/27/2017).   Ortho Exam  Patient is alert, oriented, no adenopathy, well-dressed, normal affect, normal respiratory effort. Examination patient has a normal gait lateral compression metatarsal heads reproduces a clunk she has good pulses she is tender to palpation over the third webspace right foot  Imaging: No results found.  Labs: No results found for: HGBA1C, ESRSEDRATE, CRP, LABURIC, REPTSTATUS, GRAMSTAIN, CULT, LABORGA  Orders:  No orders of the defined types were placed in this encounter.  No orders of the defined types were placed in this encounter.    Procedures: No procedures performed  Clinical Data: No additional findings.  ROS:  All other systems negative, except as noted in the HPI. Review of Systems  Objective: Vital Signs: Ht 5'  8" (1.727 m)   Wt 166 lb (75.3 kg)   BMI 25.24 kg/m   Specialty Comments:  No specialty comments available.  PMFS History: Patient Active Problem List   Diagnosis Date Noted  . Morton neuroma, right 03/18/2017  . Metatarsalgia of right foot 01/12/2017  . History of ovarian cyst 10/24/2015  . Contraception management 10/20/2015  . Palpitations 10/20/2015   Past Medical History:  Diagnosis Date  . Contraception management 10/20/2015   OCP  . History of ovarian cyst 10/24/2015  . Palpitations 10/20/2015    History reviewed. No pertinent family history.  Past Surgical History:  Procedure Laterality Date  . APPENDECTOMY  2004  . BREAST SURGERY  12/2012   BREAT AGUMENTATION   Social History   Occupational History  . Not on file.   Social History Main Topics  . Smoking status: Never Smoker  . Smokeless tobacco: Never Used  . Alcohol use No  . Drug use: No  . Sexual activity: No

## 2017-05-26 ENCOUNTER — Encounter (HOSPITAL_COMMUNITY): Payer: Self-pay | Admitting: *Deleted

## 2017-05-26 ENCOUNTER — Other Ambulatory Visit (INDEPENDENT_AMBULATORY_CARE_PROVIDER_SITE_OTHER): Payer: Self-pay | Admitting: Family

## 2017-05-26 NOTE — Progress Notes (Signed)
Pt denies SOB, chest pain, and being under the care of a cardiologist. Pt denies having a stress test, echo and cardiac cath. Pt denies having a chest x ray and EKG within the last year. Pt denies having recent labs. Pt made aware to stop taking  Aspirin vitamins, fish oil, and herbal medications. Do not take any NSAIDs ie: Ibuprofen, Advil, Naproxen (Aleve), Motrin, BC and Goody Powder or any medication containing Aspirin. Pt verbalized understanding of all pre-op instructions.

## 2017-05-27 ENCOUNTER — Ambulatory Visit (HOSPITAL_COMMUNITY): Payer: 59 | Admitting: Certified Registered Nurse Anesthetist

## 2017-05-27 ENCOUNTER — Encounter (HOSPITAL_COMMUNITY): Payer: Self-pay | Admitting: *Deleted

## 2017-05-27 ENCOUNTER — Ambulatory Visit (HOSPITAL_COMMUNITY)
Admission: RE | Admit: 2017-05-27 | Discharge: 2017-05-27 | Disposition: A | Discharge status: Home / Self Care | Payer: 59 | Source: Ambulatory Visit | Attending: Orthopedic Surgery | Admitting: Orthopedic Surgery

## 2017-05-27 ENCOUNTER — Encounter (HOSPITAL_COMMUNITY)
Admission: RE | Disposition: A | Discharge status: Home / Self Care | Payer: Self-pay | Source: Ambulatory Visit | Attending: Orthopedic Surgery

## 2017-05-27 DIAGNOSIS — G5761 Lesion of plantar nerve, right lower limb: Secondary | ICD-10-CM | POA: Diagnosis not present

## 2017-05-27 DIAGNOSIS — R002 Palpitations: Secondary | ICD-10-CM | POA: Diagnosis not present

## 2017-05-27 DIAGNOSIS — M7741 Metatarsalgia, right foot: Secondary | ICD-10-CM | POA: Diagnosis not present

## 2017-05-27 HISTORY — DX: Headache, unspecified: R51.9

## 2017-05-27 HISTORY — DX: Headache: R51

## 2017-05-27 HISTORY — DX: Lesion of plantar nerve, right lower limb: G57.61

## 2017-05-27 HISTORY — PX: EXCISION MORTON'S NEUROMA: SHX5013

## 2017-05-27 LAB — I-STAT BETA HCG BLOOD, ED (NOT ORDERABLE)

## 2017-05-27 LAB — CBC
HCT: 44.1 % (ref 36.0–46.0)
Hemoglobin: 15.4 g/dL — ABNORMAL HIGH (ref 12.0–15.0)
MCH: 30.5 pg (ref 26.0–34.0)
MCHC: 34.9 g/dL (ref 30.0–36.0)
MCV: 87.3 fL (ref 78.0–100.0)
PLATELETS: 197 10*3/uL (ref 150–400)
RBC: 5.05 MIL/uL (ref 3.87–5.11)
RDW: 13.2 % (ref 11.5–15.5)
WBC: 6.6 10*3/uL (ref 4.0–10.5)

## 2017-05-27 SURGERY — EXCISION, MORTON'S NEUROMA
Anesthesia: General | Laterality: Right

## 2017-05-27 MED ORDER — PROPOFOL 10 MG/ML IV BOLUS
INTRAVENOUS | Status: DC | PRN
  Administered 2017-05-27: 200 mg via INTRAVENOUS

## 2017-05-27 MED ORDER — CHLORHEXIDINE GLUCONATE 4 % EX LIQD: 60.0000 mL | Freq: Once | CUTANEOUS | Status: DC

## 2017-05-27 MED ORDER — OXYCODONE HCL 5 MG PO TABS: 5.0000 mg | ORAL_TABLET | Freq: Once | ORAL | Status: DC | PRN

## 2017-05-27 MED ORDER — CEFAZOLIN SODIUM-DEXTROSE 2-4 GM/100ML-% IV SOLN
2.0000 g | INTRAVENOUS | Status: AC
  Administered 2017-05-27: 2 g via INTRAVENOUS

## 2017-05-27 MED ORDER — SCOPOLAMINE 1 MG/3DAYS TD PT72
MEDICATED_PATCH | TRANSDERMAL | Status: AC
  Filled 2017-05-27: qty 1

## 2017-05-27 MED ORDER — DEXAMETHASONE SODIUM PHOSPHATE 10 MG/ML IJ SOLN
INTRAMUSCULAR | Status: DC | PRN
  Administered 2017-05-27: 10 mg via INTRAVENOUS

## 2017-05-27 MED ORDER — OXYCODONE-ACETAMINOPHEN 5-325 MG PO TABS: 1.0000 | ORAL_TABLET | ORAL | 0 refills | Status: DC | PRN

## 2017-05-27 MED ORDER — ONDANSETRON HCL 4 MG/2ML IJ SOLN: 4.0000 mg | Freq: Four times a day (QID) | INTRAMUSCULAR | Status: DC | PRN

## 2017-05-27 MED ORDER — MIDAZOLAM HCL 2 MG/2ML IJ SOLN
INTRAMUSCULAR | Status: AC
  Filled 2017-05-27: qty 2

## 2017-05-27 MED ORDER — LACTATED RINGERS IV SOLN
INTRAVENOUS | Status: DC
  Administered 2017-05-27: 10:00:00 via INTRAVENOUS

## 2017-05-27 MED ORDER — PROPOFOL 10 MG/ML IV BOLUS
INTRAVENOUS | Status: AC
  Filled 2017-05-27: qty 20

## 2017-05-27 MED ORDER — LIDOCAINE HCL (CARDIAC) 20 MG/ML IV SOLN
INTRAVENOUS | Status: DC | PRN
  Administered 2017-05-27: 80 mg via INTRAVENOUS

## 2017-05-27 MED ORDER — 0.9 % SODIUM CHLORIDE (POUR BTL) OPTIME
TOPICAL | Status: DC | PRN
  Administered 2017-05-27: 1000 mL

## 2017-05-27 MED ORDER — OXYCODONE HCL 5 MG/5ML PO SOLN: 5.0000 mg | Freq: Once | ORAL | Status: DC | PRN

## 2017-05-27 MED ORDER — CEFAZOLIN SODIUM-DEXTROSE 2-4 GM/100ML-% IV SOLN
INTRAVENOUS | Status: AC
  Filled 2017-05-27: qty 100

## 2017-05-27 MED ORDER — FENTANYL CITRATE (PF) 100 MCG/2ML IJ SOLN
INTRAMUSCULAR | Status: DC | PRN
  Administered 2017-05-27 (×6): 25 ug via INTRAVENOUS

## 2017-05-27 MED ORDER — ROCURONIUM BROMIDE 50 MG/5ML IV SOLN
INTRAVENOUS | Status: AC
  Filled 2017-05-27: qty 1

## 2017-05-27 MED ORDER — ONDANSETRON HCL 4 MG/2ML IJ SOLN
INTRAMUSCULAR | Status: DC | PRN
  Administered 2017-05-27: 4 mg via INTRAVENOUS

## 2017-05-27 MED ORDER — FENTANYL CITRATE (PF) 250 MCG/5ML IJ SOLN
INTRAMUSCULAR | Status: AC
  Filled 2017-05-27: qty 5

## 2017-05-27 MED ORDER — MIDAZOLAM HCL 5 MG/5ML IJ SOLN
INTRAMUSCULAR | Status: DC | PRN
  Administered 2017-05-27: 2 mg via INTRAVENOUS

## 2017-05-27 MED ORDER — FENTANYL CITRATE (PF) 100 MCG/2ML IJ SOLN: 25.0000 ug | INTRAMUSCULAR | Status: DC | PRN

## 2017-05-27 MED FILL — OXYCODONE-ACETAMINOPHEN 5-3: 5-325 | 10 days supply | Qty: 60 | Fill #0

## 2017-05-27 SURGICAL SUPPLY — 47 items
BLADE AVERAGE 25X9 (BLADE) IMPLANT
BLADE MINI RND TIP GREEN BEAV (BLADE) IMPLANT
BNDG COHESIVE 1X5 TAN STRL LF (GAUZE/BANDAGES/DRESSINGS) IMPLANT
BNDG COHESIVE 4X5 TAN STRL (GAUZE/BANDAGES/DRESSINGS) ×2 IMPLANT
BNDG COHESIVE 6X5 TAN STRL LF (GAUZE/BANDAGES/DRESSINGS) IMPLANT
BNDG ESMARK 4X9 LF (GAUZE/BANDAGES/DRESSINGS) ×4 IMPLANT
BNDG GAUZE ELAST 4 BULKY (GAUZE/BANDAGES/DRESSINGS) ×2 IMPLANT
BNDG GAUZE STRTCH 6 (GAUZE/BANDAGES/DRESSINGS) IMPLANT
CORDS BIPOLAR (ELECTRODE) ×2 IMPLANT
COTTON STERILE ROLL (GAUZE/BANDAGES/DRESSINGS) IMPLANT
COVER SURGICAL LIGHT HANDLE (MISCELLANEOUS) ×4 IMPLANT
CUFF TOURNIQUET SINGLE 18IN (TOURNIQUET CUFF) IMPLANT
CUFF TOURNIQUET SINGLE 24IN (TOURNIQUET CUFF) IMPLANT
DRAPE OEC MINIVIEW 54X84 (DRAPES) IMPLANT
DRAPE U-SHAPE 47X51 STRL (DRAPES) ×2 IMPLANT
DRSG ADAPTIC 3X8 NADH LF (GAUZE/BANDAGES/DRESSINGS) ×2 IMPLANT
DURAPREP 26ML APPLICATOR (WOUND CARE) ×2 IMPLANT
ELECT REM PT RETURN 9FT ADLT (ELECTROSURGICAL) ×2
ELECTRODE REM PT RTRN 9FT ADLT (ELECTROSURGICAL) ×1 IMPLANT
GAUZE SPONGE 2X2 8PLY STRL LF (GAUZE/BANDAGES/DRESSINGS) IMPLANT
GAUZE SPONGE 4X4 12PLY STRL (GAUZE/BANDAGES/DRESSINGS) ×2 IMPLANT
GLOVE BIO SURGEON STRL SZ7 (GLOVE) ×2 IMPLANT
GLOVE BIOGEL PI IND STRL 9 (GLOVE) ×1 IMPLANT
GLOVE BIOGEL PI INDICATOR 9 (GLOVE) ×1
GLOVE SURG ORTHO 9.0 STRL STRW (GLOVE) ×2 IMPLANT
GOWN STRL REUS W/ TWL XL LVL3 (GOWN DISPOSABLE) ×2 IMPLANT
GOWN STRL REUS W/TWL XL LVL3 (GOWN DISPOSABLE) ×2
KIT BASIN OR (CUSTOM PROCEDURE TRAY) ×2 IMPLANT
KIT ROOM TURNOVER OR (KITS) ×2 IMPLANT
MANIFOLD NEPTUNE II (INSTRUMENTS) IMPLANT
NEEDLE HYPO 25GX1X1/2 BEV (NEEDLE) IMPLANT
NS IRRIG 1000ML POUR BTL (IV SOLUTION) ×2 IMPLANT
PACK ORTHO EXTREMITY (CUSTOM PROCEDURE TRAY) ×2 IMPLANT
PAD ARMBOARD 7.5X6 YLW CONV (MISCELLANEOUS) ×4 IMPLANT
PAD CAST 4YDX4 CTTN HI CHSV (CAST SUPPLIES) IMPLANT
PADDING CAST COTTON 4X4 STRL (CAST SUPPLIES)
SPECIMEN JAR SMALL (MISCELLANEOUS) ×2 IMPLANT
SPONGE GAUZE 2X2 STER 10/PKG (GAUZE/BANDAGES/DRESSINGS)
SUCTION FRAZIER HANDLE 10FR (MISCELLANEOUS)
SUCTION TUBE FRAZIER 10FR DISP (MISCELLANEOUS) IMPLANT
SUT ETHILON 3 0 FSLX (SUTURE) IMPLANT
SUT VIC AB 2-0 FS1 27 (SUTURE) IMPLANT
SYR CONTROL 10ML LL (SYRINGE) IMPLANT
TOWEL OR 17X24 6PK STRL BLUE (TOWEL DISPOSABLE) ×2 IMPLANT
TOWEL OR 17X26 10 PK STRL BLUE (TOWEL DISPOSABLE) ×2 IMPLANT
TUBE CONNECTING 12X1/4 (SUCTIONS) IMPLANT
WATER STERILE IRR 1000ML POUR (IV SOLUTION) ×2 IMPLANT

## 2017-05-27 NOTE — H&P (Signed)
Kelsey Brooks is an 26 y.o. female.   Chief Complaint: Pain third webspace right foot. HPI: Patient is a 26 year old woman with a Morton's neuroma third webspace right foot she has a clunking pain she has had temporary relief with injections she can perform activities of daily living and presents at this time for surgical intervention.  Past Medical History:  Diagnosis Date  . Contraception management 10/20/2015   OCP  . Headache   . History of ovarian cyst 10/24/2015  . Morton's neuroma of right foot   . Palpitations 10/20/2015    Past Surgical History:  Procedure Laterality Date  . APPENDECTOMY  2004  . BREAST SURGERY  12/2012   BREAT AGUMENTATION  . WISDOM TOOTH EXTRACTION      Family History  Problem Relation Age of Onset  . Hypertension Father   . Asthma Father    Social History:  reports that she has never smoked. She has never used smokeless tobacco. She reports that she does not drink alcohol or use drugs.  Allergies:  Allergies  Allergen Reactions  . No Known Allergies     Medications Prior to Admission  Medication Sig Dispense Refill  . ibuprofen (ADVIL,MOTRIN) 200 MG tablet Take 400 mg by mouth every 6 (six) hours as needed for mild pain.      Results for orders placed or performed during the hospital encounter of 05/27/17 (from the past 48 hour(s))  CBC     Status: Abnormal   Collection Time: 05/27/17  8:10 AM  Result Value Ref Range   WBC 6.6 4.0 - 10.5 K/uL   RBC 5.05 3.87 - 5.11 MIL/uL   Hemoglobin 15.4 (H) 12.0 - 15.0 g/dL   HCT 44.1 36.0 - 46.0 %   MCV 87.3 78.0 - 100.0 fL   MCH 30.5 26.0 - 34.0 pg   MCHC 34.9 30.0 - 36.0 g/dL   RDW 13.2 11.5 - 15.5 %   Platelets 197 150 - 400 K/uL  I-Stat beta hCG blood, ED     Status: None   Collection Time: 05/27/17  8:14 AM  Result Value Ref Range   I-stat hCG, quantitative <5.0 <5 mIU/mL   Comment 3            Comment:   GEST. AGE      CONC.  (mIU/mL)   <=1 WEEK        5 - 50     2 WEEKS       50 - 500    3 WEEKS       100 - 10,000     4 WEEKS     1,000 - 30,000        FEMALE AND NON-PREGNANT FEMALE:     LESS THAN 5 mIU/mL    No results found.  Review of Systems  All other systems reviewed and are negative.   Blood pressure 122/74, pulse 66, temperature 98.3 F (36.8 C), temperature source Oral, resp. rate 20, height 5\' 8"  (1.727 m), weight 166 lb (75.3 kg), last menstrual period 05/10/2017, SpO2 100 %. Physical Exam  Examination patient is a good dorsalis pedis pulse her foot is neurovascularly intact she has good ankle good subtalar motion she is point tender to palpation over the third web space. Lateral compression the metatarsal heads reproduces a painful clunk. Assessment/Plan Assessment: Morton's neuroma right foot third webspace.  Plan: We'll plan for excision of the Morton's neuroma. Risks and benefits were discussed including infection neurovascular injury pain DVT need  for additional surgery. Patient states she understands wish to proceed at this time.  Newt Minion, MD 05/27/2017, 9:10 AM

## 2017-05-27 NOTE — Anesthesia Postprocedure Evaluation (Signed)
Anesthesia Post Note  Patient: Kelsey Brooks  Procedure(s) Performed: Procedure(s) (LRB): EXCISION MORTON'S NEUROMA RIGHT 3RD WEB SPACE (Right)     Patient location during evaluation: PACU Anesthesia Type: General Level of consciousness: awake and alert and patient cooperative Pain management: pain level controlled Vital Signs Assessment: post-procedure vital signs reviewed and stable Respiratory status: spontaneous breathing and respiratory function stable Cardiovascular status: stable Anesthetic complications: no    Last Vitals:  Vitals:   05/27/17 1045 05/27/17 1050  BP: 103/74 (!) 114/93  Pulse: (!) 51 (!) 53  Resp: 11   Temp: (!) 36.4 C     Last Pain:  Vitals:   05/27/17 1045  TempSrc:   PainSc: 0-No pain                 Charlis Harner S

## 2017-05-27 NOTE — Anesthesia Procedure Notes (Signed)
Procedure Name: LMA Insertion Date/Time: 05/27/2017 9:50 AM Performed by: Valda Favia Pre-anesthesia Checklist: Patient identified, Emergency Drugs available, Suction available, Patient being monitored and Timeout performed Patient Re-evaluated:Patient Re-evaluated prior to induction Oxygen Delivery Method: Circle system utilized Preoxygenation: Pre-oxygenation with 100% oxygen Induction Type: IV induction LMA: LMA inserted LMA Size: 4.0 Number of attempts: 1 Placement Confirmation: positive ETCO2 and breath sounds checked- equal and bilateral Tube secured with: Tape Dental Injury: Teeth and Oropharynx as per pre-operative assessment

## 2017-05-27 NOTE — Anesthesia Preprocedure Evaluation (Signed)
Anesthesia Evaluation  Patient identified by MRN, date of birth, ID band Patient awake    Reviewed: Allergy & Precautions, H&P , NPO status , Patient's Chart, lab work & pertinent test results  Airway Mallampati: II   Neck ROM: full    Dental   Pulmonary neg pulmonary ROS,    breath sounds clear to auscultation       Cardiovascular negative cardio ROS   Rhythm:regular Rate:Normal     Neuro/Psych  Headaches,  Neuromuscular disease    GI/Hepatic   Endo/Other    Renal/GU      Musculoskeletal   Abdominal   Peds  Hematology   Anesthesia Other Findings   Reproductive/Obstetrics                             Anesthesia Physical Anesthesia Plan  ASA: II  Anesthesia Plan: General   Post-op Pain Management:    Induction: Intravenous  PONV Risk Score and Plan: 3 and Ondansetron, Dexamethasone, Propofol and Midazolam  Airway Management Planned: LMA  Additional Equipment:   Intra-op Plan:   Post-operative Plan:   Informed Consent: I have reviewed the patients History and Physical, chart, labs and discussed the procedure including the risks, benefits and alternatives for the proposed anesthesia with the patient or authorized representative who has indicated his/her understanding and acceptance.     Plan Discussed with: CRNA, Anesthesiologist and Surgeon  Anesthesia Plan Comments:         Anesthesia Quick Evaluation

## 2017-05-27 NOTE — Transfer of Care (Signed)
Immediate Anesthesia Transfer of Care Note  Patient: Kelsey Brooks  Procedure(s) Performed: Procedure(s): EXCISION MORTON'S NEUROMA RIGHT 3RD WEB SPACE (Right)  Patient Location: PACU  Anesthesia Type:General  Level of Consciousness: awake, alert  and oriented  Airway & Oxygen Therapy: Patient Spontanous Breathing and Patient connected to nasal cannula oxygen  Post-op Assessment: Report given to RN and Post -op Vital signs reviewed and stable  Post vital signs: Reviewed and stable  Last Vitals:  Vitals:   05/27/17 0820 05/27/17 1028  BP: 122/74 107/68  Pulse: 66 79  Resp: 20 12  Temp: 36.8 C (!) 36.4 C    Last Pain:  Vitals:   05/27/17 1028  TempSrc:   PainSc: (P) 0-No pain      Patients Stated Pain Goal: 2 (67/25/50 0164)  Complications: No apparent anesthesia complications

## 2017-05-27 NOTE — Op Note (Signed)
05/27/2017  10:13 AM  PATIENT:  Kelsey Brooks    PRE-OPERATIVE DIAGNOSIS:  Morton's Neuroma Right Foot  POST-OPERATIVE DIAGNOSIS:  Same  PROCEDURE:  EXCISION MORTON'S NEUROMA RIGHT 3RD WEB SPACE  SURGEON:  Newt Minion, MD  PHYSICIAN ASSISTANT:None ANESTHESIA:   General  PREOPERATIVE INDICATIONS:  Kelsey Brooks is a  26 y.o. female with a diagnosis of Morton's Neuroma Right Foot who failed conservative measures and elected for surgical management.    The risks benefits and alternatives were discussed with the patient preoperatively including but not limited to the risks of infection, bleeding, nerve injury, cardiopulmonary complications, the need for revision surgery, among others, and the patient was willing to proceed.  OPERATIVE IMPLANTS: None  OPERATIVE FINDINGS: Right foot third webspace neuroma.  OPERATIVE PROCEDURE: Patient was brought the operating room and underwent a general anesthetic. After adequate levels anesthesia obtained patient's right lower extremity was prepped using DuraPrep draped into a sterile field a timeout was called. A dorsal incision was made over the third webspace blunt dissection was carried down to the intermetatarsal ligament this was released. With plantar pressure the neuroma was delivered to the surgical site. With retraction with a Allis clamp the proximal and distal limbs were cut the neuroma was removed. There was no malignant signs there was good sprouting of the nerve bundles no complicating features.  The wound was irrigated with normal saline incision was closed using 3-0 nylon. A compressive dressing was applied patient was extubated taken to the PACU in stable condition.

## 2017-05-27 NOTE — Progress Notes (Signed)
Orthopedic Tech Progress Note Patient Details:  Kelsey Brooks Feb 20, 1991 [id number]  Ortho Devices Type of Ortho Device: Postop shoe/boot Ortho Device/Splint Location: rle Ortho Device/Splint Interventions: Application   Marcus Schwandt 05/27/2017, 10:54 AM Viewed order from doctor's order list

## 2017-05-28 ENCOUNTER — Encounter (HOSPITAL_COMMUNITY): Payer: Self-pay | Admitting: Orthopedic Surgery

## 2017-06-10 ENCOUNTER — Encounter (INDEPENDENT_AMBULATORY_CARE_PROVIDER_SITE_OTHER): Payer: Self-pay | Admitting: Family

## 2017-06-10 ENCOUNTER — Ambulatory Visit (INDEPENDENT_AMBULATORY_CARE_PROVIDER_SITE_OTHER): Payer: 59 | Admitting: Family

## 2017-06-10 VITALS — Ht 68.0 in | Wt 166.0 lb

## 2017-06-10 DIAGNOSIS — G5761 Lesion of plantar nerve, right lower limb: Secondary | ICD-10-CM

## 2017-06-13 NOTE — Progress Notes (Signed)
   Post-Op Visit Note   Patient: Kelsey Brooks           Date of Birth: 13-Aug-1991           MRN: [id number] Visit Date: 06/10/2017 PCP: Emeterio Reeve, DO  Chief Complaint:  Chief Complaint  Patient presents with  . Right Foot - Routine Post Op    05/27/17 excision morton's neuroma 3rd web space    HPI:  The patient is a 26 year old woman who is status post excision of Morton's neuroma on July 13. Having mild swelling complaining of a little numbness and tingling in her toes. Has been weightbearing without incident.    Ortho Exam Incision is clean and dry well approximated with sutures healing well. Minimal swelling to the foot.  Visit Diagnoses:  1. Morton neuroma, right     Plan: Sutures harvested today she'll follow-up in office in 2 weeks. Advance weightbearing as tolerated.  Follow-Up Instructions: Return in about 2 weeks (around 06/24/2017).   Imaging: No results found.  Orders:  No orders of the defined types were placed in this encounter.  No orders of the defined types were placed in this encounter.    PMFS History: Patient Active Problem List   Diagnosis Date Noted  . Morton neuroma, right 03/18/2017  . Metatarsalgia of right foot 01/12/2017  . History of ovarian cyst 10/24/2015  . Contraception management 10/20/2015  . Palpitations 10/20/2015   Past Medical History:  Diagnosis Date  . Contraception management 10/20/2015   OCP  . Headache   . History of ovarian cyst 10/24/2015  . Morton's neuroma of right foot   . Palpitations 10/20/2015    Family History  Problem Relation Age of Onset  . Hypertension Father   . Asthma Father     Past Surgical History:  Procedure Laterality Date  . APPENDECTOMY  2004  . BREAST SURGERY  12/2012   BREAT AGUMENTATION  . EXCISION MORTON'S NEUROMA Right 05/27/2017   Procedure: EXCISION MORTON'S NEUROMA RIGHT 3RD WEB SPACE;  Surgeon: Newt Minion, MD;  Location: [facility];  Service: Orthopedics;  Laterality:  Right;  . WISDOM TOOTH EXTRACTION     Social History   Occupational History  . Not on file.   Social History Main Topics  . Smoking status: Never Smoker  . Smokeless tobacco: Never Used  . Alcohol use No  . Drug use: No  . Sexual activity: No

## 2017-06-17 ENCOUNTER — Ambulatory Visit (INDEPENDENT_AMBULATORY_CARE_PROVIDER_SITE_OTHER): Payer: 59 | Admitting: Family

## 2017-06-17 ENCOUNTER — Encounter (INDEPENDENT_AMBULATORY_CARE_PROVIDER_SITE_OTHER): Payer: Self-pay | Admitting: Family

## 2017-06-17 VITALS — Ht 68.0 in | Wt 166.0 lb

## 2017-06-17 DIAGNOSIS — G5761 Lesion of plantar nerve, right lower limb: Secondary | ICD-10-CM

## 2017-06-17 NOTE — Progress Notes (Signed)
   Post-Op Visit Note   Patient: Kelsey Brooks           Date of Birth: 05-19-1991           MRN: [id number] Visit Date: 06/17/2017 PCP: Emeterio Reeve, DO  Chief Complaint:  Chief Complaint  Patient presents with  . Right Foot - Routine Post Op    05/27/17 foot excision morton's neuroma right 3rd webspace.     HPI:  The patient is a 26 year old woman who is seen today 3 weeks status post excision of Morton's neuroma on the right. She is concerned that her incision she states she has a Doberman Pincher, that he stepped on her incision twice she has Steri-Strips in place states that this dehisced earlier this week. Has had bloody drainage and swelling.    Ortho Exam Incision is approximated and healing. No depth. Does have eschar in incision bed. No odor, surrounding erythema or sign of infection. Moderate swelling to fore foot.  Visit Diagnoses:  1. Morton neuroma, right     Plan: back off on weight bearing. Continue daily incision care and dry dressings. Follow up office in 2 weeks.   Follow-Up Instructions: Return in about 2 weeks (around 07/01/2017), or if symptoms worsen or fail to improve.   Imaging: No results found.  Orders:  No orders of the defined types were placed in this encounter.  No orders of the defined types were placed in this encounter.    PMFS History: Patient Active Problem List   Diagnosis Date Noted  . Morton neuroma, right 03/18/2017  . Metatarsalgia of right foot 01/12/2017  . History of ovarian cyst 10/24/2015  . Contraception management 10/20/2015  . Palpitations 10/20/2015   Past Medical History:  Diagnosis Date  . Contraception management 10/20/2015   OCP  . Headache   . History of ovarian cyst 10/24/2015  . Morton's neuroma of right foot   . Palpitations 10/20/2015    Family History  Problem Relation Age of Onset  . Hypertension Father   . Asthma Father     Past Surgical History:  Procedure Laterality Date  . APPENDECTOMY   2004  . BREAST SURGERY  12/2012   BREAT AGUMENTATION  . EXCISION MORTON'S NEUROMA Right 05/27/2017   Procedure: EXCISION MORTON'S NEUROMA RIGHT 3RD WEB SPACE;  Surgeon: Newt Minion, MD;  Location: [facility];  Service: Orthopedics;  Laterality: Right;  . WISDOM TOOTH EXTRACTION     Social History   Occupational History  . Not on file.   Social History Main Topics  . Smoking status: Never Smoker  . Smokeless tobacco: Never Used  . Alcohol use No  . Drug use: No  . Sexual activity: No

## 2017-08-18 ENCOUNTER — Encounter: Payer: Self-pay | Admitting: Osteopathic Medicine

## 2017-08-18 ENCOUNTER — Ambulatory Visit (INDEPENDENT_AMBULATORY_CARE_PROVIDER_SITE_OTHER): Payer: 59 | Admitting: Osteopathic Medicine

## 2017-08-18 VITALS — BP 114/75 | HR 71 | Ht 68.0 in | Wt 175.0 lb

## 2017-08-18 DIAGNOSIS — Z0000 Encounter for general adult medical examination without abnormal findings: Secondary | ICD-10-CM | POA: Diagnosis not present

## 2017-08-18 DIAGNOSIS — Z1331 Encounter for screening for depression: Secondary | ICD-10-CM

## 2017-08-18 DIAGNOSIS — Z3041 Encounter for surveillance of contraceptive pills: Secondary | ICD-10-CM | POA: Diagnosis not present

## 2017-08-18 MED ORDER — NORETHINDRONE ACET-ETHINYL EST 1-20 MG-MCG PO TABS: 1.0000 | ORAL_TABLET | Freq: Every day | ORAL | 3 refills | Status: DC

## 2017-08-18 NOTE — Progress Notes (Signed)
HPI: Kelsey Brooks is a 26 y.o. female  who presents to [facility] today, 08/18/17,  for chief complaint of:  Chief Complaint  Patient presents with  . Annual Exam   Patient here for annual physical / wellness exam.  See preventive care reviewed as below.   Additional concerns today include: none On period - would like to defer pap today      Past medical, surgical, social and family history reviewed: Patient Active Problem List   Diagnosis Date Noted  . Morton neuroma, right 03/18/2017  . Metatarsalgia of right foot 01/12/2017  . History of ovarian cyst 10/24/2015  . Contraception management 10/20/2015  . Palpitations 10/20/2015   Past Surgical History:  Procedure Laterality Date  . APPENDECTOMY  2004  . BREAST SURGERY  12/2012   BREAT AGUMENTATION  . EXCISION MORTON'S NEUROMA Right 05/27/2017   Procedure: EXCISION MORTON'S NEUROMA RIGHT 3RD WEB SPACE;  Surgeon: Newt Minion, MD;  Location: [facility];  Service: Orthopedics;  Laterality: Right;  . WISDOM TOOTH EXTRACTION     Social History  Substance Use Topics  . Smoking status: Never Smoker  . Smokeless tobacco: Never Used  . Alcohol use No   Family History  Problem Relation Age of Onset  . Hypertension Father   . Asthma Father      Current medication list and allergy/intolerance information reviewed:   Current Outpatient Prescriptions  Medication Sig Dispense Refill  . ibuprofen (ADVIL,MOTRIN) 200 MG tablet Take 400 mg by mouth every 6 (six) hours as needed for mild pain.    Marland Kitchen norethindrone-ethinyl estradiol (MICROGESTIN,JUNEL,LOESTRIN) 1-20 MG-MCG tablet Take 1 tablet by mouth daily.     No current facility-administered medications for this visit.    Allergies  Allergen Reactions  . No Known Allergies       Review of Systems:  Constitutional:  No  fever, no chills, No recent illness, No unintentional weight changes. No significant fatigue.   HEENT: No  headache, no  vision change, no hearing change, No sore throat, No  sinus pressure  Cardiac: No  chest pain, No  pressure, No palpitations  Respiratory:  No  shortness of breath. No  Cough  Gastrointestinal: No  abdominal pain, No  nausea, No  vomiting,  No  blood in stool, No  diarrhea, No  constipation   Musculoskeletal: No new myalgia/arthralgia  Genitourinary: No  incontinence, No  abnormal genital bleeding, No abnormal genital discharge  Skin: No  Rash, No other wounds/concerning lesions  Hem/Onc: No  easy bruising/bleeding  Neurologic: No  weakness, No  dizziness,  Psychiatric: No  concerns with depression, No  concerns with anxiety, No sleep problems, No mood problems  Exam:  BP 114/75   Pulse 71   Ht 5\' 8"  (1.727 m)   Wt 175 lb (79.4 kg)   BMI 26.61 kg/m   Constitutional: VS see above. General Appearance: alert, well-developed, well-nourished, NAD  Eyes: Normal lids and conjunctive, non-icteric sclera  Ears, Nose, Mouth, Throat: MMM, Normal external inspection ears/nares/mouth/lips/gums. TM normal bilaterally. Pharynx/tonsils no erythema, no exudate. Nasal mucosa normal.   Neck: No masses, trachea midline. No thyroid enlargement. No tenderness/mass appreciated. No lymphadenopathy  Respiratory: Normal respiratory effort. no wheeze, no rhonchi, no rales  Cardiovascular: S1/S2 normal, no murmur, no rub/gallop auscultated. RRR. No lower extremity edema.   Gastrointestinal: Nontender, no masses. No hepatomegaly, no splenomegaly. No hernia appreciated. Bowel sounds normal. Rectal exam deferred.   Musculoskeletal: Gait normal. No clubbing/cyanosis of  digits.   Neurological: Normal balance/coordination. No tremor. No cranial nerve deficit on limited exam. Motor and sensation intact and symmetric. Cerebellar reflexes intact.   Skin: warm, dry, intact. No rash/ulcer. No concerning nevi or subq nodules on limited exam.    Psychiatric: Normal judgment/insight. Normal mood and affect.  Oriented x3.      ASSESSMENT/PLAN:   Annual physical exam - Plan: CBC, COMPLETE METABOLIC PANEL WITH GFR, Lipid panel  Positive depression screening - discussed - (+)stress at work, getting back on OCP some mood issues - no MDD  Encounter for surveillance of contraceptive pills - currently on menstrual cycle   FEMALE PREVENTIVE CARE Updated 08/18/17   ANNUAL SCREENING/COUNSELING  Diet/Exercise - HEALTHY HABITS DISCUSSED TO DECREASE CV RISK History  Smoking Status  . Never Smoker  Smokeless Tobacco  . Never Used   History  Alcohol Use No   Depression screen PHQ 2/9 08/18/2017  Decreased Interest 2  Down, Depressed, Hopeless 1  PHQ - 2 Score 3  Altered sleeping 2  Tired, decreased energy 2  Change in appetite 1  Feeling bad or failure about yourself  1  Trouble concentrating 1  Moving slowly or fidgety/restless 0  Suicidal thoughts 0  PHQ-9 Score 10    Domestic violence concerns - no  HTN SCREENING - SEE [facility]  Sexually active in the past year - Yes with female. - married, monogamous   Need/want STI testing today? - no  Concerns about libido or pain with sex? - no  Plans for pregnancy? - waiting for now   INFECTIOUS DISEASE SCREENING  HIV - does not need  GC/CT - does not need  HepC - DOB [birth year] - does not need  TB - does not need  DISEASE SCREENING  Lipid - needs  DM2 - needs  Osteoporosis - women age 69+ - does not need  CANCER SCREENING  Cervical - needs  Breast - does not need - no FH  Lung - does not need  Colon - does not need - no FH  ADULT VACCINATION  Influenza - annual vaccine recommended - declined  Td - booster every 10 years - thinks was before college about 8 years ago - declined today   Zoster - option at 85, yes at 60+   PCV13 - was not indicated  PPSV23 - was not indicated  There is no immunization history on file for this patient.    Visit summary with medication list and pertinent  instructions was printed for patient to review. All questions at time of visit were answered - patient instructed to contact office with any additional concerns. ER/RTC precautions were reviewed with the patient. Follow-up plan: Return for pap at your convenience in next few months .

## 2017-09-09 DIAGNOSIS — L709 Acne, unspecified: Secondary | ICD-10-CM | POA: Diagnosis not present

## 2017-09-09 DIAGNOSIS — D485 Neoplasm of uncertain behavior of skin: Secondary | ICD-10-CM | POA: Diagnosis not present

## 2017-09-09 MED FILL — MINOCYCLINE 100 MG CAPSULE: 100 | 30 days supply | Qty: 60 | Fill #0

## 2017-09-21 DIAGNOSIS — F430 Acute stress reaction: Secondary | ICD-10-CM | POA: Diagnosis not present

## 2017-10-12 DIAGNOSIS — F430 Acute stress reaction: Secondary | ICD-10-CM | POA: Diagnosis not present

## 2018-04-12 ENCOUNTER — Encounter: Payer: Self-pay | Admitting: Osteopathic Medicine

## 2018-04-12 ENCOUNTER — Other Ambulatory Visit (HOSPITAL_COMMUNITY)
Admission: RE | Admit: 2018-04-12 | Discharge: 2018-04-12 | Disposition: A | Discharge status: Home / Self Care | Payer: No Typology Code available for payment source | Source: Ambulatory Visit | Attending: Osteopathic Medicine | Admitting: Osteopathic Medicine

## 2018-04-12 ENCOUNTER — Ambulatory Visit (INDEPENDENT_AMBULATORY_CARE_PROVIDER_SITE_OTHER): Payer: No Typology Code available for payment source | Admitting: Osteopathic Medicine

## 2018-04-12 VITALS — BP 132/79 | HR 67 | Temp 98.3°F | Wt 161.4 lb

## 2018-04-12 DIAGNOSIS — Z124 Encounter for screening for malignant neoplasm of cervix: Secondary | ICD-10-CM

## 2018-04-12 DIAGNOSIS — F329 Major depressive disorder, single episode, unspecified: Secondary | ICD-10-CM | POA: Diagnosis not present

## 2018-04-12 DIAGNOSIS — F419 Anxiety disorder, unspecified: Secondary | ICD-10-CM | POA: Diagnosis not present

## 2018-04-12 DIAGNOSIS — F32A Depression, unspecified: Secondary | ICD-10-CM

## 2018-04-12 MED ORDER — ALPRAZOLAM 0.5 MG PO TABS: 0.2500 mg | ORAL_TABLET | Freq: Two times a day (BID) | ORAL | 0 refills | Status: DC | PRN

## 2018-04-12 MED ORDER — ESCITALOPRAM OXALATE 10 MG PO TABS: 10.0000 mg | ORAL_TABLET | Freq: Every day | ORAL | 1 refills | Status: DC

## 2018-04-12 NOTE — Patient Instructions (Signed)
We are starting a medication today called Lexapro/escitalopram to help treat your anxiety/depression as daily maintenance/prevention. This is a daily medication to help control your symptoms.   Try the as-needed medicine, Xanax/alprazolam up to 2 times per day, try half dose first as this drug can cause sleepiness.   Let's plan to follow up in the office in 2 weeks. At that time, we can talk about how well the medicine is working for you, and we can consider increasing the dose, adding or changing medicine, etc.   If we are having trouble finding a good medication regimen for you, we can consult with a psychiatrist to assist with medication management, but hopefully we will not need this. I also encourage you to continue counseling/therapy!   If you experience problematic side effects, please let me know ASAP - we can switch the medicine any time, and we don't need an appointment for this.   If mental health crisis in a situation where I cannot be reached, I recommend proceeding to any emergency room or to [facility] ([address] [place], [place] [phone], [phone]) for immediate mental health services.   Any questions or concerns, call me!

## 2018-04-12 NOTE — Progress Notes (Signed)
HPI: Kelsey Brooks is a 27 y.o. female who  has a past medical history of Contraception management (10/20/2015), Headache, History of ovarian cyst (10/24/2015), Morton's neuroma of right foot, and Palpitations (10/20/2015).  she presents to [facility] today, 04/12/18,  for chief complaint of:   Due for Pap  Recently separated from her husband and going through some emotional stuff now  Anxiety and depression are starting to affect her work and personal life. She has been in the process of going to therapy and trying to make things work but recent separation for good has her symptoms understandably worsening. 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 She reports her anxiety is her greatest concern.    GAD 7 : Generalized Anxiety Score 04/12/2018  Nervous, Anxious, on Edge 3  Control/stop worrying 3  Worry too much - different things 3  Trouble relaxing 3  Restless 2  Easily annoyed or irritable 3  Afraid - awful might happen 2  Total GAD 7 Score 19  Anxiety Difficulty Extremely difficult    Depression screen [facility] 2/9 04/12/2018 08/18/2017  Decreased Interest 2 2  Down, Depressed, Hopeless 3 1  PHQ - 2 Score 5 3  Altered sleeping 2 2  Tired, decreased energy 2 2  Change in appetite 2 1  Feeling bad or failure about yourself  3 1  Trouble concentrating 3 1  Moving slowly or fidgety/restless 3 0  Suicidal thoughts 0 0  PHQ-9 Score 20 10  Difficult doing work/chores Extremely dIfficult -      Past medical history, surgical history, and family history reviewed.  Current medication list and allergy/intolerance information reviewed.   (See remainder of HPI, ROS, Phys Exam below)    ASSESSMENT/PLAN:   Anxiety and depression  Cervical cancer screening - Plan: Cytology - PAP   Meds ordered this encounter  Medications  . escitalopram (LEXAPRO) 10 MG tablet    Sig: Take 1 tablet (10 mg  total) by mouth daily.    Dispense:  30 tablet    Refill:  1  . ALPRAZolam (XANAX) 0.5 MG tablet    Sig: Take 0.5-1 tablets (0.25-0.5 mg total) by mouth 2 (two) times daily as needed for anxiety. Sparing use to prevent dependence    Dispense:  30 tablet    Refill:  0    Patient Instructions  We are starting a medication today called Lexapro/escitalopram to help treat your anxiety/depression as daily maintenance/prevention. This is a daily medication to help control your symptoms.   Try the as-needed medicine, Xanax/alprazolam up to 2 times per day, try half dose first as this drug can cause sleepiness.   Let's plan to follow up in the office in 2 weeks. At that time, we can talk about how well the medicine is working for you, and we can consider increasing the dose, adding or changing medicine, etc.   If we are having trouble finding a good medication regimen for you, we can consult with a psychiatrist to assist with medication management, but hopefully we will not need this. I also encourage you to continue counseling/therapy!   If you experience problematic side effects, please let me know ASAP - we can switch the medicine any time, and we don't need an appointment for this.   If mental health crisis in a situation where I cannot be reached, I recommend proceeding to any emergency room or to [facility] ([address], [place],  [place] [address], [phone]) for immediate mental health services.   Any questions or concerns, call me!       Follow-up plan: Return in about 2 weeks (around 04/26/2018) for recheck on anxiety/depression medications, sooner if needed .     ############################################ ############################################ ############################################ ############################################    Outpatient Encounter Medications as of 04/12/2018  Medication Sig  . ibuprofen (ADVIL,MOTRIN) 200 MG  tablet Take 400 mg by mouth every 6 (six) hours as needed for mild pain.  Marland Kitchen norethindrone-ethinyl estradiol (MICROGESTIN,JUNEL,LOESTRIN) 1-20 MG-MCG tablet Take 1 tablet by mouth daily.   No facility-administered encounter medications on file as of 04/12/2018.    Allergies  Allergen Reactions  . No Known Allergies       Review of Systems:  Constitutional: No recent illness  HEENT: No  headache, no vision change  Cardiac: No  chest pain, No  pressure, No palpitations  Respiratory:  No  shortness of breath.  Musculoskeletal: No new myalgia/arthralgia  Skin: No  Rash  Neurologic: No  weakness, No  Dizziness  Psychiatric: +concerns with depression, +concerns with anxiety, no sleep problems   Exam:  BP 132/79 (BP Location: Left Arm, Patient Position: Sitting, Cuff Size: Normal)   Pulse 67   Temp 98.3 F (36.8 C) (Oral)   Wt 161 lb 6.4 oz (73.2 kg)   LMP 03/21/2018   BMI 24.54 kg/m   Constitutional: VS see above. General Appearance: alert, well-developed, well-nourished, NAD  Eyes: Normal lids and conjunctive, non-icteric sclera  Ears, Nose, Mouth, Throat: MMM, Normal external inspection ears/nares/mouth/lips/gums.  Neck: No masses, trachea midline.   Respiratory: Normal respiratory effort.   Musculoskeletal: Gait normal. Symmetric and independent movement of all extremities  Neurological: Normal balance/coordination. No tremor.  Skin: warm, dry, intact.   Psychiatric: Normal judgment/insight. Normal mood and affect. Oriented x3.  GYN: No lesions/ulcers to external genitalia, normal urethra, normal vaginal mucosa, physiologic discharge, cervix normal without lesions, uterus not enlarged or tender, adnexa no masses and nontender  BREAST: No rashes/skin changes, normal fibrous breast tissue, no masses or tenderness, normal nipple without discharge, normal axilla   Visit summary with medication list and pertinent instructions was printed for patient to review,  advised to alert [place] if any changes needed. All questions at time of visit were answered - patient instructed to contact office with any additional concerns. ER/RTC precautions were reviewed with the patient and understanding verbalized.   Follow-up plan: Return in about 2 weeks (around 04/26/2018) for recheck on anxiety/depression medications, sooner if needed .  Note: Total time spent 25 minutes, greater than 50% of the visit was spent face-to-face counseling and coordinating care for the following: The primary encounter diagnosis was Anxiety and depression. A diagnosis of Cervical cancer screening was also pertinent to this visit.Marland Kitchen  Please note: voice recognition software was used to produce this document, and typos may escape review. Please contact Dr. Sheppard Coil for any needed clarifications.

## 2018-04-21 LAB — CYTOLOGY - PAP
HPV 16/18/45 genotyping: NEGATIVE
HPV: DETECTED — AB

## 2018-04-26 ENCOUNTER — Encounter: Payer: Self-pay | Admitting: Osteopathic Medicine

## 2018-04-26 ENCOUNTER — Ambulatory Visit (INDEPENDENT_AMBULATORY_CARE_PROVIDER_SITE_OTHER): Payer: No Typology Code available for payment source | Admitting: Osteopathic Medicine

## 2018-04-26 VITALS — BP 128/72 | HR 65 | Temp 98.4°F | Wt 161.7 lb

## 2018-04-26 DIAGNOSIS — F329 Major depressive disorder, single episode, unspecified: Secondary | ICD-10-CM | POA: Diagnosis not present

## 2018-04-26 DIAGNOSIS — F419 Anxiety disorder, unspecified: Secondary | ICD-10-CM | POA: Diagnosis not present

## 2018-04-26 DIAGNOSIS — R8789 Other abnormal findings in specimens from female genital organs: Secondary | ICD-10-CM | POA: Diagnosis not present

## 2018-04-26 DIAGNOSIS — R87618 Other abnormal cytological findings on specimens from cervix uteri: Secondary | ICD-10-CM | POA: Insufficient documentation

## 2018-04-26 DIAGNOSIS — F32A Depression, unspecified: Secondary | ICD-10-CM

## 2018-04-26 DIAGNOSIS — R87612 Low grade squamous intraepithelial lesion on cytologic smear of cervix (LGSIL): Secondary | ICD-10-CM | POA: Diagnosis not present

## 2018-04-26 NOTE — Progress Notes (Signed)
HPI: Kelsey Brooks is a 27 y.o. female who  has a past medical history of Contraception management (10/20/2015), Headache, History of ovarian cyst (10/24/2015), Morton's neuroma of right foot, and Palpitations (10/20/2015).  she presents to [facility] today, 04/26/18,  for chief complaint of:  Check anxiety Review Pap results  Doing well on Lexapro, sparing use of alprazolam, not usually needed.  No adverse events from the Lexapro.  Spent some time discussing abnormal Pap results.  She actually did receive the Gardasil vaccine when she was younger, HPV testing was positive but negative for strains 16 or 18.   Past medical history, surgical history, and family history reviewed.  Current medication list and allergy/intolerance information reviewed.   (See remainder of HPI, ROS, Phys Exam below)    ASSESSMENT/PLAN:   LGSIL on Pap smear of cervix - Plan: Ambulatory referral to Obstetrics / Gynecology  Pap smear abnormality of cervix/human papillomavirus (HPV) positive  Anxiety and depression - Doing well on current medications, let us give it another 4 weeks and at that point can discuss just refilling medications versus increasing dose or change rx     Follow-up plan: Return in about 1 month (around 05/24/2018) for recheck on Lexaproif needed - if doing well, can refill x90 days w/ 1 refill and see me in 6 mos     ############################################ ############################################ ############################################ ############################################    Outpatient Encounter Medications as of 04/26/2018  Medication Sig  . ALPRAZolam (XANAX) 0.5 MG tablet Take 0.5-1 tablets (0.25-0.5 mg total) by mouth 2 (two) times daily as needed for anxiety. Sparing use to prevent dependence  . escitalopram (LEXAPRO) 10 MG tablet Take 1 tablet (10 mg total) by mouth daily.  Marland Kitchen ibuprofen (ADVIL,MOTRIN) 200 MG tablet Take  400 mg by mouth every 6 (six) hours as needed for mild pain.  Marland Kitchen norethindrone-ethinyl estradiol (MICROGESTIN,JUNEL,LOESTRIN) 1-20 MG-MCG tablet Take 1 tablet by mouth daily.   No facility-administered encounter medications on file as of 04/26/2018.    Allergies  Allergen Reactions  . No Known Allergies       Review of Systems:  Constitutional: No recent illness  Cardiac: No  chest pain  Respiratory:  No  shortness of breath.  Psychiatric: No  concerns with depression, +concerns with anxiety  Exam:  BP 128/72 (BP Location: Left Arm, Patient Position: Sitting, Cuff Size: Normal)   Pulse 65   Temp 98.4 F (36.9 C) (Oral)   Wt 161 lb 11.2 oz (73.3 kg)   BMI 24.59 kg/m   Constitutional: VS see above. General Appearance: alert, well-developed, well-nourished, NAD  Eyes: Normal lids and conjunctive, non-icteric sclera  Ears, Nose, Mouth, Throat: MMM, Normal external inspection ears/nares/mouth/lips/gums.  Neck: No masses, trachea midline.   Respiratory: Normal respiratory effort.   Musculoskeletal: Gait normal. Symmetric and independent movement of all extremities  Neurological: Normal balance/coordination. No tremor.  Skin: warm, dry, intact.   Psychiatric: Normal judgment/insight. Normal mood and affect. Oriented x3.   Visit summary with medication list and pertinent instructions was printed for patient to review, advised to alert [place] if any changes needed. All questions at time of visit were answered - patient instructed to contact office with any additional concerns. ER/RTC precautions were reviewed with the patient and understanding verbalized.   Follow-up plan: Return in about 1 month (around 05/24/2018) for recheck on Lexaproif needed - if doing well, can refill x90 days.  Note: Total time spent 25 minutes, greater than 50% of the visit was spent  face-to-face counseling and coordinating care for the following: The primary encounter diagnosis was LGSIL on Pap smear of  cervix. Diagnoses of Pap smear abnormality of cervix/human papillomavirus (HPV) positive and Anxiety and depression were also pertinent to this visit.Marland Kitchen  Please note: voice recognition software was used to produce this document, and typos may escape review. Please contact Dr. Sheppard Coil for any needed clarifications.

## 2018-05-23 ENCOUNTER — Encounter: Payer: Self-pay | Admitting: Obstetrics and Gynecology

## 2018-05-23 ENCOUNTER — Ambulatory Visit (INDEPENDENT_AMBULATORY_CARE_PROVIDER_SITE_OTHER): Payer: No Typology Code available for payment source | Admitting: Obstetrics and Gynecology

## 2018-05-23 VITALS — BP 116/78 | HR 64 | Resp 16 | Ht 68.0 in | Wt 165.0 lb

## 2018-05-23 DIAGNOSIS — Z01812 Encounter for preprocedural laboratory examination: Secondary | ICD-10-CM

## 2018-05-23 DIAGNOSIS — R87612 Low grade squamous intraepithelial lesion on cytologic smear of cervix (LGSIL): Secondary | ICD-10-CM | POA: Diagnosis not present

## 2018-05-23 DIAGNOSIS — Z3202 Encounter for pregnancy test, result negative: Secondary | ICD-10-CM

## 2018-05-23 LAB — POCT URINE PREGNANCY: Preg Test, Ur: NEGATIVE

## 2018-05-23 NOTE — Progress Notes (Signed)
Patient with LGSIL on 04/12/2018 here for colposcopy. Patient reports receiving Gardasil vaccine series previously  Patient given informed consent, signed copy in the chart, time out was performed.  Placed in lithotomy position. Cervix viewed with speculum and colposcope after application of acetic acid.   Colposcopy adequate?  yes Acetowhite lesions? Yes 6-11 o'clock Punctation? no Mosaicism?  no Abnormal vasculature?  no Biopsies? Yes 7 and 10 o;clock ECC? yes  COMMENTS:  Patient was given post procedure instructions.  She will return in 2 weeks for results.  Mora Bellman, MD

## 2018-06-30 ENCOUNTER — Other Ambulatory Visit: Payer: Self-pay | Admitting: Osteopathic Medicine

## 2018-06-30 NOTE — Telephone Encounter (Signed)
[facility] requesting med RFs for alprazolam and escitalopram.

## 2018-07-03 ENCOUNTER — Other Ambulatory Visit: Payer: Self-pay | Admitting: Sports Medicine

## 2018-07-03 MED ORDER — ALPRAZOLAM 0.5 MG PO TABS: 0.2500 mg | ORAL_TABLET | Freq: Two times a day (BID) | ORAL | 0 refills | Status: DC | PRN

## 2018-07-05 NOTE — Telephone Encounter (Signed)
OK to refill but this alprazolam Rx will be #30 for 90 days to prevent over-use please call patient and make sure she is aware of this change. Thanks!

## 2018-07-05 NOTE — Telephone Encounter (Signed)
Left message for patient to call back  

## 2018-07-25 MED FILL — ESCITALOPRAM 10 MG TABLET: 10 | 90 days supply | Qty: 90 | Fill #0

## 2018-07-26 ENCOUNTER — Other Ambulatory Visit: Payer: Self-pay | Admitting: Osteopathic Medicine

## 2018-08-02 ENCOUNTER — Emergency Department
Admission: EM | Admit: 2018-08-02 | Discharge: 2018-08-02 | Disposition: A | Discharge status: Home / Self Care | Payer: No Typology Code available for payment source | Source: Home / Self Care | Attending: Family Medicine | Admitting: Family Medicine

## 2018-08-02 ENCOUNTER — Encounter: Payer: Self-pay | Admitting: *Deleted

## 2018-08-02 ENCOUNTER — Other Ambulatory Visit: Payer: Self-pay

## 2018-08-02 DIAGNOSIS — J020 Streptococcal pharyngitis: Secondary | ICD-10-CM

## 2018-08-02 LAB — POCT RAPID STREP A (OFFICE): Rapid Strep A Screen: POSITIVE — AB

## 2018-08-02 MED ORDER — PENICILLIN V POTASSIUM 500 MG PO TABS: ORAL_TABLET | ORAL | 0 refills | Status: DC

## 2018-08-02 NOTE — ED Triage Notes (Signed)
Pt c/o sore throat, body aches,HA and chills x 1 day. No OTC meds. Denies fever. Last dose IBF last night.

## 2018-08-02 NOTE — Discharge Instructions (Addendum)
Try warm salt water gargles for sore throat.  Try Ibuprofen 200mg , 4 tabs every 8 hours with food.

## 2018-08-02 NOTE — ED Provider Notes (Signed)
[name]    CSN: [id number] Arrival date & time: 08/02/18  0940     History   Chief Complaint Chief Complaint  Patient presents with  . Sore Throat    HPI Kelsey Brooks is a 27 y.o. female.   Yesterday patient developed sore throat, fatigue, headache, myalgias, and chills.  No nasal congestion or cough.  Normal urination.  The history is provided by the patient.    Past Medical History:  Diagnosis Date  . Contraception management 10/20/2015   OCP  . Headache   . History of ovarian cyst 10/24/2015  . Morton's neuroma of right foot   . Palpitations 10/20/2015    Patient Active Problem List   Diagnosis Date Noted  . LGSIL on Pap smear of cervix 04/26/2018  . Pap smear abnormality of cervix/human papillomavirus (HPV) positive 04/26/2018  . Anxiety and depression 04/12/2018  . Morton neuroma, right 03/18/2017  . Metatarsalgia of right foot 01/12/2017  . History of ovarian cyst 10/24/2015  . Contraception management 10/20/2015  . Palpitations 10/20/2015    Past Surgical History:  Procedure Laterality Date  . APPENDECTOMY  2004  . BREAST SURGERY  12/2012   BREAT AGUMENTATION  . EXCISION MORTON'S NEUROMA Right 05/27/2017   Procedure: EXCISION MORTON'S NEUROMA RIGHT 3RD WEB SPACE;  Surgeon: Newt Minion, MD;  Location: [place];  Service: Orthopedics;  Laterality: Right;  . WISDOM TOOTH EXTRACTION      OB History    Gravida  0   Para  0   Term  0   Preterm  0   AB  0   Living  0     SAB  0   TAB  0   Ectopic  0   Multiple  0   Live Births  0            Home Medications    Prior to Admission medications   Medication Sig Start Date End Date Taking? Authorizing Provider  escitalopram (LEXAPRO) 10 MG tablet TAKE 1 TABLET(10 MG) BY MOUTH DAILY 07/05/18  Yes Emeterio Reeve, DO  norethindrone-ethinyl estradiol (MICROGESTIN,JUNEL,LOESTRIN) 1-20 MG-MCG tablet Take 1 tablet by mouth daily. Pt needs follow up appt w/Provider. 07/26/18   Yes Emeterio Reeve, DO  ALPRAZolam Duanne Moron) 0.5 MG tablet Take 0.5-1 tablets (0.25-0.5 mg total) by mouth 2 (two) times daily as needed for anxiety (#30 for 90(ninety) days - use sparingly). 07/05/18   Emeterio Reeve, DO  ibuprofen (ADVIL,MOTRIN) 200 MG tablet Take 400 mg by mouth every 6 (six) hours as needed for mild pain.    [provider]  penicillin v potassium (VEETID) 500 MG tablet Take one tab by mouth twice daily for 10 days 08/02/18   Kandra Nicolas, MD    Family History Family History  Problem Relation Age of Onset  . Hypertension Father   . Asthma Father     Social History Social History   Tobacco Use  . Smoking status: Never Smoker  . Smokeless tobacco: Never Used  Substance Use Topics  . Alcohol use: No  . Drug use: No     Allergies   No known allergies   Review of Systems Review of Systems + sore throat No cough No pleuritic pain No wheezing No nasal congestion ? post-nasal drainage No sinus pain/pressure No itchy/red eyes No earache No hemoptysis No SOB No fever, + chills No nausea No vomiting No abdominal pain No diarrhea No urinary symptoms No skin rash + fatigue +  myalgias + headache Used OTC meds without relief   Physical Exam Triage Vital Signs ED Triage Vitals  Enc Vitals Group     BP 08/02/18 1043 130/86     Pulse Rate 08/02/18 1043 71     Resp 08/02/18 1043 16     Temp 08/02/18 1043 98.6 F (37 C)     Temp Source 08/02/18 1043 Oral     SpO2 08/02/18 1043 100 %     Weight 08/02/18 1044 159 lb (72.1 kg)     Height 08/02/18 1044 5\' 8"  (1.727 m)     Head Circumference --      Peak Flow --      Pain Score 08/02/18 1043 0     Pain Loc --      Pain Edu? --      Excl. in [place]? --    No data found.  Updated Vital Signs BP 130/86 (BP Location: Right Arm)   Pulse 71   Temp 98.6 F (37 C) (Oral)   Resp 16   Ht 5\' 8"  (1.727 m)   Wt 72.1 kg   LMP 07/24/2018   SpO2 100%   BMI 24.18 kg/m   Visual  Acuity Right Eye Distance:   Left Eye Distance:   Bilateral Distance:    Right Eye Near:   Left Eye Near:    Bilateral Near:     Physical Exam Nursing notes and Vital Signs reviewed. Appearance:  Patient appears stated age, and in no acute distress Eyes:  Pupils are equal, round, and reactive to light and accomodation.  Extraocular movement is intact.  Conjunctivae are not inflamed  Ears:  Canals normal.  Tympanic membranes normal.  Nose:   Normal turbinates.  No sinus tenderness.   Pharynx:  Erythematous and slightly swollen without obstruction.  Neck:  Supple.  Enlarged tender tonsillar nodes.   Lungs:  Clear to auscultation.  Breath sounds are equal.  Moving air well. Heart:  Regular rate and rhythm without murmurs, rubs, or gallops.  Abdomen:  Nontender  Extremities:  No edema.  Skin:  No rash present.    UC Treatments / Results  Labs (all labs ordered are listed, but only abnormal results are displayed) Labs Reviewed  POCT RAPID STREP A (OFFICE) - Abnormal; Notable for the following components:      Result Value   Rapid Strep A Screen Positive (*)    All other components within normal limits    EKG None  Radiology No results found.  Procedures Procedures (including critical care time)  Medications Ordered in UC Medications - No data to display  Initial Impression / Assessment and Plan / UC Course  I have reviewed the triage vital signs and the nursing notes.  Pertinent labs & imaging results that were available during my care of the patient were reviewed by me and considered in my medical decision making (see chart for details).    Begin Pen VK Followup with Family Doctor if not improved in 10 days.   Final Clinical Impressions(s) / UC Diagnoses   Final diagnoses:  Streptococcus pharyngitis     Discharge Instructions     Try warm salt water gargles for sore throat.  Try Ibuprofen 200mg , 4 tabs every 8 hours with food.     ED Prescriptions     Medication Sig Dispense Auth. Provider   penicillin v potassium (VEETID) 500 MG tablet Take one tab by mouth twice daily for 10 days 20 tablet Kandra Nicolas,  MD         Kandra Nicolas, MD 08/02/18 1131

## 2018-10-22 ENCOUNTER — Other Ambulatory Visit: Payer: Self-pay | Admitting: Osteopathic Medicine

## 2018-11-16 ENCOUNTER — Ambulatory Visit (HOSPITAL_COMMUNITY)
Admission: EM | Admit: 2018-11-16 | Discharge: 2018-11-16 | Disposition: A | Discharge status: Home / Self Care | Payer: No Typology Code available for payment source | Attending: Family Medicine | Admitting: Family Medicine

## 2018-11-16 ENCOUNTER — Encounter (HOSPITAL_COMMUNITY): Payer: Self-pay | Admitting: Emergency Medicine

## 2018-11-16 ENCOUNTER — Other Ambulatory Visit: Payer: Self-pay

## 2018-11-16 DIAGNOSIS — J111 Influenza due to unidentified influenza virus with other respiratory manifestations: Secondary | ICD-10-CM

## 2018-11-16 DIAGNOSIS — R6889 Other general symptoms and signs: Secondary | ICD-10-CM | POA: Diagnosis not present

## 2018-11-16 DIAGNOSIS — R69 Illness, unspecified: Secondary | ICD-10-CM | POA: Insufficient documentation

## 2018-11-16 MED ORDER — OSELTAMIVIR PHOSPHATE 75 MG PO CAPS: 75.0000 mg | ORAL_CAPSULE | Freq: Two times a day (BID) | ORAL | 0 refills | Status: DC

## 2018-11-16 NOTE — ED Provider Notes (Signed)
[facility]    CSN: [id number] Arrival date & time: 11/16/18  1914     History   Chief Complaint Chief Complaint  Patient presents with  . Appointment  . Influenza    HPI Kelsey Brooks is a 28 y.o. female.   HPI  Patient was exposed to father with influenza over Christmas.  Now she has chest congestion, body aches, nausea, fever, vomiting.  She symptoms started day before yesterday.  She is extremely tired and achy.  Her temperature at home was 101.  She has been taking ibuprofen and Tylenol. Patient is usually in good health.  She is on Lexapro for a generalized anxiety disorder and infrequent Xanax.  Takes her birth control as p prescribed.  Past Medical History:  Diagnosis Date  . Contraception management 10/20/2015   OCP  . Headache   . History of ovarian cyst 10/24/2015  . Morton's neuroma of right foot   . Palpitations 10/20/2015    Patient Active Problem List   Diagnosis Date Noted  . LGSIL on Pap smear of cervix 04/26/2018  . Pap smear abnormality of cervix/human papillomavirus (HPV) positive 04/26/2018  . Anxiety and depression 04/12/2018  . Morton neuroma, right 03/18/2017  . Metatarsalgia of right foot 01/12/2017  . History of ovarian cyst 10/24/2015  . Contraception management 10/20/2015  . Palpitations 10/20/2015    Past Surgical History:  Procedure Laterality Date  . APPENDECTOMY  2004  . BREAST SURGERY  12/2012   BREAT AGUMENTATION  . EXCISION MORTON'S NEUROMA Right 05/27/2017   Procedure: EXCISION MORTON'S NEUROMA RIGHT 3RD WEB SPACE;  Surgeon: Newt Minion, MD;  Location: [place];  Service: Orthopedics;  Laterality: Right;  . WISDOM TOOTH EXTRACTION      OB History    Gravida  0   Para  0   Term  0   Preterm  0   AB  0   Living  0     SAB  0   TAB  0   Ectopic  0   Multiple  0   Live Births  0            Home Medications    Prior to Admission medications   Medication Sig Start Date End Date Taking?  Authorizing Provider  norethindrone-ethinyl estradiol (MICROGESTIN,JUNEL,LOESTRIN) 1-20 MG-MCG tablet Take 1 tablet by mouth daily. 10/23/18  Yes Emeterio Reeve, DO  ALPRAZolam Duanne Moron) 0.5 MG tablet Take 0.5-1 tablets (0.25-0.5 mg total) by mouth 2 (two) times daily as needed for anxiety (#30 for 90(ninety) days - use sparingly). 07/05/18   Emeterio Reeve, DO  escitalopram (LEXAPRO) 10 MG tablet TAKE 1 TABLET(10 MG) BY MOUTH DAILY 07/05/18   Emeterio Reeve, DO  ibuprofen (ADVIL,MOTRIN) 200 MG tablet Take 400 mg by mouth every 6 (six) hours as needed for mild pain.    [provider]  oseltamivir (TAMIFLU) 75 MG capsule Take 1 capsule (75 mg total) by mouth every 12 (twelve) hours. 11/16/18   Raylene Everts, MD    Family History Family History  Problem Relation Age of Onset  . Hypertension Father   . Asthma Father     Social History Social History   Tobacco Use  . Smoking status: Never Smoker  . Smokeless tobacco: Never Used  Substance Use Topics  . Alcohol use: No  . Drug use: No     Allergies   No known allergies   Review of Systems Review of Systems  Constitutional: Positive for chills,  fatigue and fever.  HENT: Positive for congestion. Negative for ear pain and sore throat.   Eyes: Negative for pain and visual disturbance.  Respiratory: Positive for cough. Negative for shortness of breath.   Cardiovascular: Negative for chest pain and palpitations.  Gastrointestinal: Positive for nausea. Negative for abdominal pain and vomiting.  Genitourinary: Negative for dysuria and hematuria.  Musculoskeletal: Negative for arthralgias and back pain.  Skin: Negative for color change and rash.  Neurological: Negative for seizures and syncope.  All other systems reviewed and are negative.    Physical Exam Triage Vital Signs ED Triage Vitals  Enc Vitals Group     BP 11/16/18 1005 126/81     Pulse Rate 11/16/18 1005 88     Resp 11/16/18 1005 16     Temp  11/16/18 1005 98.4 F (36.9 C)     Temp Source 11/16/18 1005 Oral     SpO2 11/16/18 1005 97 %     Weight --      Height --      Head Circumference --      Peak Flow --      Pain Score 11/16/18 1004 8     Pain Loc --      Pain Edu? --      Excl. in [place]? --    No data found.  Updated Vital Signs BP 126/81   Pulse 88   Temp 98.4 F (36.9 C) (Oral)   Resp 16   LMP 11/02/2018   SpO2 97%       Physical Exam Constitutional:      General: She is not in acute distress.    Appearance: She is well-developed. She is ill-appearing.     Comments: Peers ill, appears tired  HENT:     Head: Normocephalic and atraumatic.     Right Ear: Tympanic membrane and ear canal normal.     Left Ear: Tympanic membrane normal.     Nose: Congestion present.     Mouth/Throat:     Pharynx: Posterior oropharyngeal erythema present.  Eyes:     Conjunctiva/sclera: Conjunctivae normal.     Pupils: Pupils are equal, round, and reactive to light.  Neck:     Musculoskeletal: Normal range of motion.  Cardiovascular:     Rate and Rhythm: Normal rate and regular rhythm.     Heart sounds: Normal heart sounds.  Pulmonary:     Effort: Pulmonary effort is normal. No respiratory distress.     Breath sounds: Normal breath sounds.  Abdominal:     General: Abdomen is flat. There is no distension.     Palpations: Abdomen is soft.  Musculoskeletal: Normal range of motion.  Skin:    General: Skin is warm and dry.  Neurological:     General: No focal deficit present.     Mental Status: She is alert. Mental status is at baseline.  Psychiatric:        Mood and Affect: Mood normal.        Thought Content: Thought content normal.      UC Treatments / Results  Labs (all labs ordered are listed, but only abnormal results are displayed) Labs Reviewed - No data to display  EKG None  Radiology No results found.  Procedures Procedures (including critical care time)  Medications Ordered in UC Medications  - No data to display  Initial Impression / Assessment and Plan / UC Course  I have reviewed the triage vital signs and the nursing notes.  Pertinent labs & imaging results that were available during my care of the patient were reviewed by me and considered in my medical decision making (see chart for details).     We do not have flu testing but I believe she has influenza based on her symptoms.  Discussed pros and cons of Tamiflu.  Patient desires treatment Final Clinical Impressions(s) / UC Diagnoses   Final diagnoses:  Flu-like symptoms  Influenza-like illness     Discharge Instructions     Rest Push fluids Tylenol or ibuprofen for pain and fever Tamiflu 2 x a day\take 2 doses today   ED Prescriptions    Medication Sig Dispense Auth. Provider   oseltamivir (TAMIFLU) 75 MG capsule Take 1 capsule (75 mg total) by mouth every 12 (twelve) hours. 10 capsule Raylene Everts, MD     Controlled Substance Prescriptions [place] Controlled Substance Registry consulted? Not Applicable   Raylene Everts, MD 11/16/18 2130

## 2018-11-16 NOTE — Discharge Instructions (Addendum)
Rest Push fluids Tylenol or ibuprofen for pain and fever Tamiflu 2 x a day\take 2 doses today

## 2018-11-16 NOTE — ED Triage Notes (Signed)
PT reports congestion, bodyaches, nausea, fever, vomiting x1. Symptoms for 2 days.

## 2019-01-09 ENCOUNTER — Ambulatory Visit (INDEPENDENT_AMBULATORY_CARE_PROVIDER_SITE_OTHER): Payer: No Typology Code available for payment source | Admitting: Family Medicine

## 2019-01-09 ENCOUNTER — Encounter: Payer: Self-pay | Admitting: Family Medicine

## 2019-01-09 ENCOUNTER — Ambulatory Visit (INDEPENDENT_AMBULATORY_CARE_PROVIDER_SITE_OTHER): Payer: No Typology Code available for payment source

## 2019-01-09 VITALS — BP 131/83 | HR 67 | Ht 68.0 in | Wt 164.0 lb

## 2019-01-09 DIAGNOSIS — M25552 Pain in left hip: Secondary | ICD-10-CM

## 2019-01-09 NOTE — Progress Notes (Signed)
Kelsey Brooks is a 28 y.o. female who presents to [facility] today for left hip pain.  Kelsey Brooks is a healthy fit young woman who exercises regularly.  She has been straining in an effort to get ready for cheerleader tryouts for the [employer].  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  She was participating in an insanity workout about 3 weeks ago.  She cannot recall any specific injury during the work-up however the next morning when she woke she had pain in the anterior left hip.  The pain is persisted and is worse with motion including hip flexion.  She has pain getting in and out of a car and climbing stairs for example.  She denies significant lateral hip pain.  She denies any radiating pain weakness or numbness distally.  She is tried ibuprofen ice and rest.  She is been doing some stretches as well as guided by previous physician.  Symptoms have been ongoing now for 3 weeks.    ROS:  As above  Exam:  BP 131/83   Pulse 67   Ht 5\' 8"  (1.727 m)   Wt 164 lb (74.4 kg)   LMP 12/19/2018   BMI 24.94 kg/m  Wt Readings from Last 5 Encounters:  01/09/19 164 lb (74.4 kg)  08/02/18 159 lb (72.1 kg)  05/23/18 165 lb (74.8 kg)  04/26/18 161 lb 11.2 oz (73.3 kg)  04/12/18 161 lb 6.4 oz (73.2 kg)   General: Well Developed, well nourished, and in no acute distress.  Neuro/Psych: Alert and oriented x3, extra-ocular muscles intact, able to move all 4 extremities, sensation grossly intact. Skin: Warm and dry, no rashes noted.  Respiratory: Not using accessory muscles, speaking in full sentences, trachea midline.  Cardiovascular: Pulses palpable, no extremity edema. Abdomen: Does not appear distended. MSK: Left hip normal-appearing Range of motion: Flexion limited to 90 degrees.  Beyond 9 degrees is painful. Normal internal/external rotation. Normal abduction. Not particularly tender to palpation. Intact  strength to hip abduction 5/5. Resisted hip flexion painful and weak at 4/5  Right hip normal-appearing nontender normal motion normal strength.    Lab and Radiology Results X-ray images personally independently reviewed. Left hip and AP pelvis.  No significant degenerative changes fractures or visible stress fractures.  Slight density at anterior superior labrum visible on AP different compared to contralateral right hip. Await formal radiology review    Assessment and Plan: 28 y.o. female with left anterior hip pain.  Pain started after a particularly intense workout.  Differential includes labrum tear, hip flexor tendinopathy or tear, or even femoral neck stress fracture. Discussed options.  Plan for trial of physical therapy and continued home exercise program.  Avoid painful activities.  Discussed exercise strategies. Recheck 3 to 4 weeks.  If not improving neck step would be hip MRI arthrogram.   PDMP not reviewed this encounter. Orders Placed This Encounter  Procedures  . DG Hip Unilat W OR W/O Pelvis 2-3 Views Left    Standing Status:   Future    Number of Occurrences:   1    Standing Expiration Date:   03/09/2020    Order Specific Question:   Reason for Exam (SYMPTOM  OR DIAGNOSIS REQUIRED)    Answer:   eval left anterior hip pain    Order Specific Question:   Is patient pregnant?    Answer:   No    Order Specific Question:   Preferred imaging location?  Answer:   [facility]    Order Specific Question:   Radiology Contrast Protocol - do NOT remove file path    Answer:   [email or web address]  . Ambulatory referral to Physical Therapy    Referral Priority:   Routine    Referral Type:   Physical Medicine    Referral Reason:   Specialty Services Required    Requested Specialty:   Physical Therapy   No orders of the defined types were placed in this encounter.   Historical information moved to improve visibility of  documentation.  Past Medical History:  Diagnosis Date  . Contraception management 10/20/2015   OCP  . Headache   . History of ovarian cyst 10/24/2015  . Morton's neuroma of right foot   . Palpitations 10/20/2015   Past Surgical History:  Procedure Laterality Date  . APPENDECTOMY  2004  . BREAST SURGERY  12/2012   BREAT AGUMENTATION  . EXCISION MORTON'S NEUROMA Right 05/27/2017   Procedure: EXCISION MORTON'S NEUROMA RIGHT 3RD WEB SPACE;  Surgeon: Newt Minion, MD;  Location: [place];  Service: Orthopedics;  Laterality: Right;  . WISDOM TOOTH EXTRACTION     Social History   Tobacco Use  . Smoking status: Never Smoker  . Smokeless tobacco: Never Used  Substance Use Topics  . Alcohol use: No   family history includes Asthma in her father; Hypertension in her father.  Medications: Current Outpatient Medications  Medication Sig Dispense Refill  . ALPRAZolam (XANAX) 0.5 MG tablet Take 0.5-1 tablets (0.25-0.5 mg total) by mouth 2 (two) times daily as needed for anxiety (#30 for 90(ninety) days - use sparingly). 30 tablet 0  . escitalopram (LEXAPRO) 10 MG tablet TAKE 1 TABLET(10 MG) BY MOUTH DAILY 90 tablet 1  . ibuprofen (ADVIL,MOTRIN) 200 MG tablet Take 400 mg by mouth every 6 (six) hours as needed for mild pain.    Marland Kitchen norethindrone-ethinyl estradiol (MICROGESTIN,JUNEL,LOESTRIN) 1-20 MG-MCG tablet Take 1 tablet by mouth daily. 3 Package 3   No current facility-administered medications for this visit.    Allergies  Allergen Reactions  . No Known Allergies       Discussed warning signs or symptoms. Please see discharge instructions. Patient expresses understanding.

## 2019-01-09 NOTE — Patient Instructions (Signed)
Thank you for coming in today. Do the hip flexor stretching as much as you can.  OK to exercises if the exercise does not hurt.  Consider swimming.  Recheck with me in 4 weeks.  Attend PT.   This could be hip flexor strain or hip labrum.  Also read about Femoral acetabular Impugnment. FAI

## 2019-01-16 ENCOUNTER — Other Ambulatory Visit: Payer: Self-pay

## 2019-01-16 ENCOUNTER — Ambulatory Visit (INDEPENDENT_AMBULATORY_CARE_PROVIDER_SITE_OTHER): Payer: No Typology Code available for payment source | Admitting: Rehabilitative and Restorative Service Providers"

## 2019-01-16 ENCOUNTER — Encounter: Payer: Self-pay | Admitting: Rehabilitative and Restorative Service Providers"

## 2019-01-16 DIAGNOSIS — R29898 Other symptoms and signs involving the musculoskeletal system: Secondary | ICD-10-CM

## 2019-01-16 DIAGNOSIS — M25552 Pain in left hip: Secondary | ICD-10-CM | POA: Diagnosis not present

## 2019-01-16 NOTE — Patient Instructions (Addendum)
TENS UNIT: This is helpful for muscle pain and spasm.   Search and Purchase a TENS 7000 2nd edition at www.tenspros.com. It should be less than $30.     TENS unit instructions: Do not shower or bathe with the unit on Turn the unit off before removing electrodes or batteries If the electrodes lose stickiness add a drop of water to the electrodes after they are disconnected from the unit and place on plastic sheet. If you continued to have difficulty, call the TENS unit company to purchase more electrodes. Do not apply lotion on the skin area prior to use. Make sure the skin is clean and dry as this will help prolong the life of the electrodes. After use, always check skin for unusual red areas, rash or other skin difficulties. If there are any skin problems, does not apply electrodes to the same area. Never remove the electrodes from the unit by pulling the wires. Do not use the TENS unit or electrodes other than as directed. Do not change electrode placement without consultating your therapist or physician. Keep 2 fingers with between each electrode.   Myofacial release work with ~ 4 inch plastic ball   Access Code: [email or web address]  URL: [email or web address]  Date: 01/16/2019  Prepared by: Gillermo Murdoch   Exercises  Hip Adductors and Hamstring Stretch with Strap - 3 reps - 1 sets - 30 sec hold - 2x daily - 7x weekly  Prone Quadriceps Stretch with Strap - 3 reps - 1 sets - 30 seconds hold - 2x daily - 7x weekly  Hip Flexor Stretch at Edge of Bed - 3 reps - 1 sets - 30 seconds hold - 2x daily - 7x weekly  Seated Hip Flexor Stretch - 3 reps - 1 sets - 30 seconds hold - 2x daily - 7x weekly

## 2019-01-16 NOTE — Therapy (Addendum)
[facility] [place], [place], [address] Phone: [phone]   Fax:  [phone]  Physical Therapy Evaluation  Patient Details  Name: Ciarrah Rae MRN: [id number] Date of Birth: [birth year] Referring Provider (PT): Dr Lynne Leader    Encounter Date: 01/16/2019  PT End of Session - 01/16/19 1243    Visit Number  1    Number of Visits  4    Date for PT Re-Evaluation  02/13/19    PT Start Time  3785    PT Stop Time  1245    PT Time Calculation (min)  57 min    Activity Tolerance  Patient tolerated treatment well       Past Medical History:  Diagnosis Date  . Contraception management 10/20/2015   OCP  . Headache   . History of ovarian cyst 10/24/2015  . Morton's neuroma of right foot   . Palpitations 10/20/2015    Past Surgical History:  Procedure Laterality Date  . APPENDECTOMY  2004  . BREAST SURGERY  12/2012   BREAT AGUMENTATION  . EXCISION MORTON'S NEUROMA Right 05/27/2017   Procedure: EXCISION MORTON'S NEUROMA RIGHT 3RD WEB SPACE;  Surgeon: Newt Minion, MD;  Location: [facility];  Service: Orthopedics;  Laterality: Right;  . WISDOM TOOTH EXTRACTION      There were no vitals filed for this visit.   Subjective Assessment - 01/16/19 1152    Subjective  Patient reports that she has had Lt hip pain for the past 3-4 weeks following insanity workout. She had difficulty lifting leg which has continued to bother her. She was seen by Dr Georgina Snell last week with xrays showing possible labrum fraying but no tear.     Pertinent History  unremarkable - per pt report; some musculoskeletal injuries related to dance and cheering nothing major     Diagnostic tests  xray     Patient Stated Goals  get hip back to normal and return to activities     Currently in Pain?  Yes    Pain Score  2     Pain Location  Hip    Pain Orientation  Left;Anterior    Pain Descriptors / Indicators  Sharp;Throbbing    Pain Type  Acute pain    Pain Radiating Towards  more in the front    Pain Onset  More than a month ago    Pain Frequency  Intermittent    Aggravating Factors   lifting leg; squatting; moving hip in or out; lifting     Pain Relieving Factors  rest; avoiding activities above          [facility] PT Assessment - 01/16/19 0001      Assessment   Medical Diagnosis  Lt hip pain     Referring Provider (PT)  Dr Lynne Leader     Onset Date/Surgical Date  12/16/18    Hand Dominance  Right    Next MD Visit  PRN     Prior Therapy  none       Precautions   Precautions  None      Restrictions   Weight Bearing Restrictions  No      Balance Screen   Has the patient fallen in the past 6 months  No    Has the patient had a decrease in activity level because of a fear of falling?   No    Is the patient reluctant to leave their home because of a fear of  falling?   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     Leisure  workout - dance; running; curcit training; swimming; yoga 5-6 days a week for 30-60 min       Observation/Other Assessments   Focus on Therapeutic Outcomes (FOTO)   37% limitation       Sensation   Additional Comments  WFL's per pt report       Posture/Postural Control   Posture Comments  knees hyperextended; increased lumbar lordosis       AROM   Overall AROM Comments  WFL's lumbar spine     Right/Left Hip  --   WFL's bilat      Strength   Overall Strength Comments  WFL's       Flexibility   Hamstrings  WFL's     Quadriceps  slightly tight Lt     ITB  WFL's     Piriformis  slightly tight Lt       Palpation   Palpation comment  tightness anteroir Lt hip       Special Tests   Other special tests  tightness with Marcello Moores test Lt       Ambulation/Gait   Gait Comments  WFL's                 Objective measurements completed on examination:  See above findings.      [facility] PT Treatment/Exercise - 01/16/19 0001      Therapeutic Activites    Therapeutic Activities  --   myofacial ball release work      Knee/Hip Exercises: Public affairs consultant  Left;3 reps;30 seconds   distal thigh on foam roll    Hip Flexor Stretch  Left;3 reps;30 seconds   supine and sitting    Other Knee/Hip Stretches  adductor stretch 30 sec x 3 reps       Moist Heat Therapy   Number Minutes Moist Heat  15 Minutes    Moist Heat Location  Lumbar Spine;Hip      Electrical Stimulation   Electrical Stimulation Location  anterior hip     Electrical Stimulation Action  IFC    Electrical Stimulation Parameters  to tolerance    Electrical Stimulation Goals  Pain;Tone             PT Education - 01/16/19 1227    Education Details  HEP TENS ball release work     [employer]) Educated  Patient    Methods  Explanation;Demonstration;Tactile cues;Verbal cues;Handout    Comprehension  Verbalized understanding;Returned demonstration;Verbal cues required;Tactile cues required       PT Short Term Goals - 01/16/19 1251      PT SHORT TERM GOAL #1   Title  Instruct in appropraite HEP - today     Time  1    Period  Days    Status  Achieved      PT SHORT TERM GOAL #2   Title  Establish additional exercises if patient schedules appointments if symptoms do not resolve 02/13/2019     Time  4    Period  Weeks    Status  New                Plan - 01/16/19 1243    Clinical  Impression Statement  Patient presents with anterior Lt hip pain following an intense workout ~ 1 month ago. Symptoms have persisted with only some iimprovement. Patient has (+) Thomas test Lt LE; palpable tightness Lt anterior hip and adductors; pain limiting functional activities. She will benefit from PT to address problems identified.     Stability/Clinical Decision Making  Stable/Uncomplicated    Clinical Decision Making  Low    Rehab Potential  Excellent    PT  Frequency  1x / week    PT Duration  4 weeks    PT Treatment/Interventions  Patient/family education;ADLs/Self Care Home Management;Cryotherapy;Electrical Stimulation;Iontophoresis 70m/ml Dexamethasone;Moist Heat;Ultrasound;Dry needling;Manual techniques;Neuromuscular re-education;Therapeutic activities;Therapeutic exercise    PT Next Visit Plan  review HEP; progress with exercises as indicated - patient will call to schedule if symptoms do not resolve.     Consulted and Agree with Plan of Care  Patient       Patient will benefit from skilled therapeutic intervention in order to improve the following deficits and impairments:  Increased fascial restricitons, Increased muscle spasms, Decreased activity tolerance, Pain  Visit Diagnosis: Pain in left hip - Plan: PT plan of care cert/re-cert  Other symptoms and signs involving the musculoskeletal system - Plan: PT plan of care cert/re-cert     Problem List Patient Active Problem List   Diagnosis Date Noted  . LGSIL on Pap smear of cervix 04/26/2018  . Pap smear abnormality of cervix/human papillomavirus (HPV) positive 04/26/2018  . Anxiety and depression 04/12/2018  . Morton neuroma, right 03/18/2017  . Metatarsalgia of right foot 01/12/2017  . History of ovarian cyst 10/24/2015  . Contraception management 10/20/2015  . Palpitations 10/20/2015    Bassel Gaskill PNilda SimmerPT, MPH  01/16/2019, 12:54 PM  [facility] [address] [address]Phone: [phone]  Fax:  [phone] Name: HJoshalyn AnchetaMRN: [id number] of Birth: [birth year]  PHYSICAL THERAPY DISCHARGE SUMMARY  Visits from Start of Care: Patient seen for evaluation only   Current functional level related to goals / functional outcomes: See evaluation    Remaining deficits: Unknown    Education / Equipment: HEP  Plan: Patient agrees to discharge.  Patient goals were partially met. Patient is  being discharged due to not returning since the last visit.  ?????   Amine Adelson P. HHelene KelpPT, MPH 04/02/19 11:44 AM

## 2019-01-24 MED FILL — ESCITALOPRAM 10 MG TABLET: 10 | 90 days supply | Qty: 90 | Fill #1

## 2019-01-24 MED FILL — NORETHIND-ETH ESTRAD 1-0.02: 1-20 | 63 days supply | Qty: 63 | Fill #0

## 2019-02-08 ENCOUNTER — Telehealth: Payer: Self-pay | Admitting: Rehabilitative and Restorative Service Providers"

## 2019-02-08 NOTE — Telephone Encounter (Signed)
LVM for patient to check on status of hip pain. Ask that [place] call to schedule if she needs additional appointments.  Celyn P. Helene Kelp PT, MPH 02/08/19 10:59 AM

## 2019-02-27 IMAGING — DX DG HIP (WITH OR WITHOUT PELVIS) 2-3V*L*
3 series · 3 of 3 positions shown · non-contrast
Comparison: Pelvic ultrasound dated 05/26/2015.

CLINICAL DATA: Left groin and hip pain since exercising 3 weeks
ago.

EXAM:
DG HIP (WITH OR WITHOUT PELVIS) 2-3V LEFT

[pelvis ap]
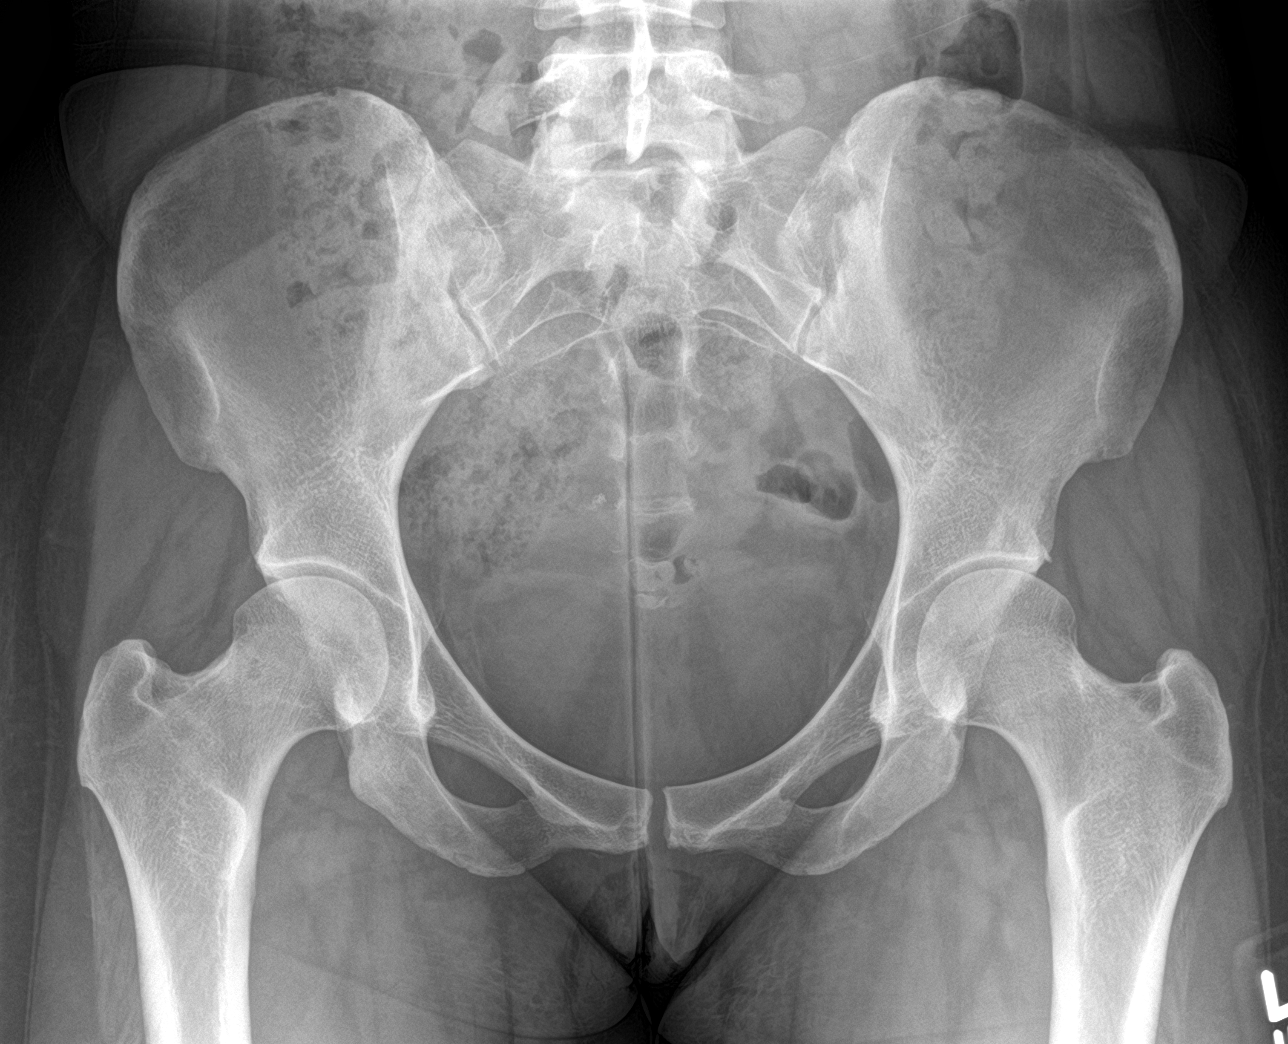

[hip ap]
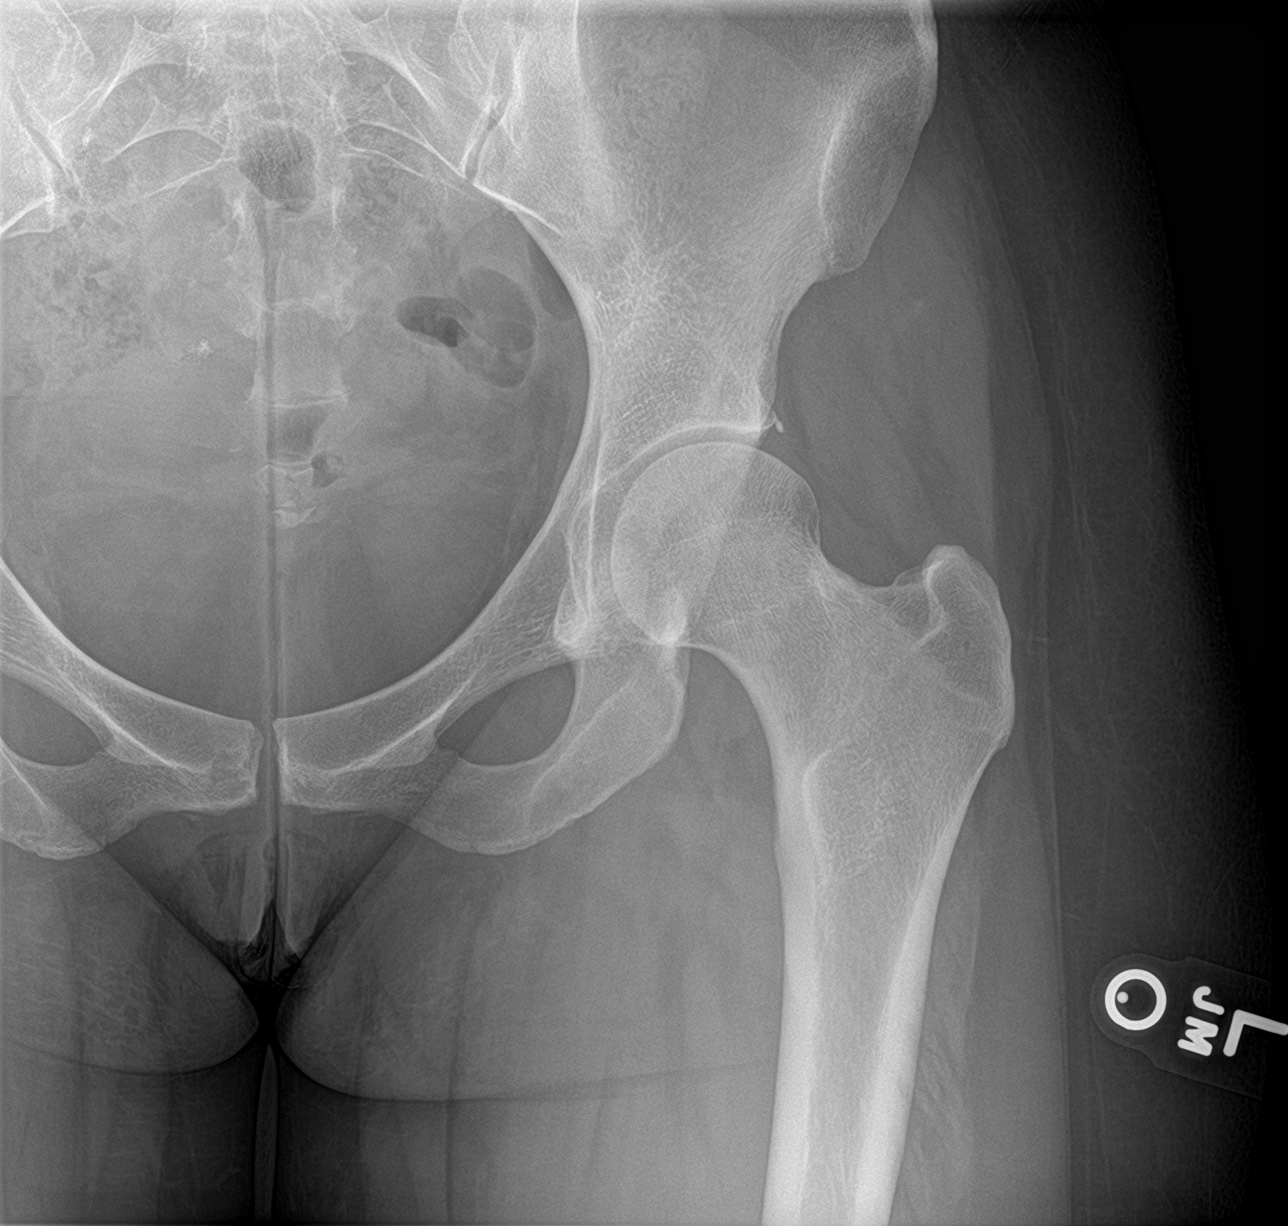

[hip frog leg]
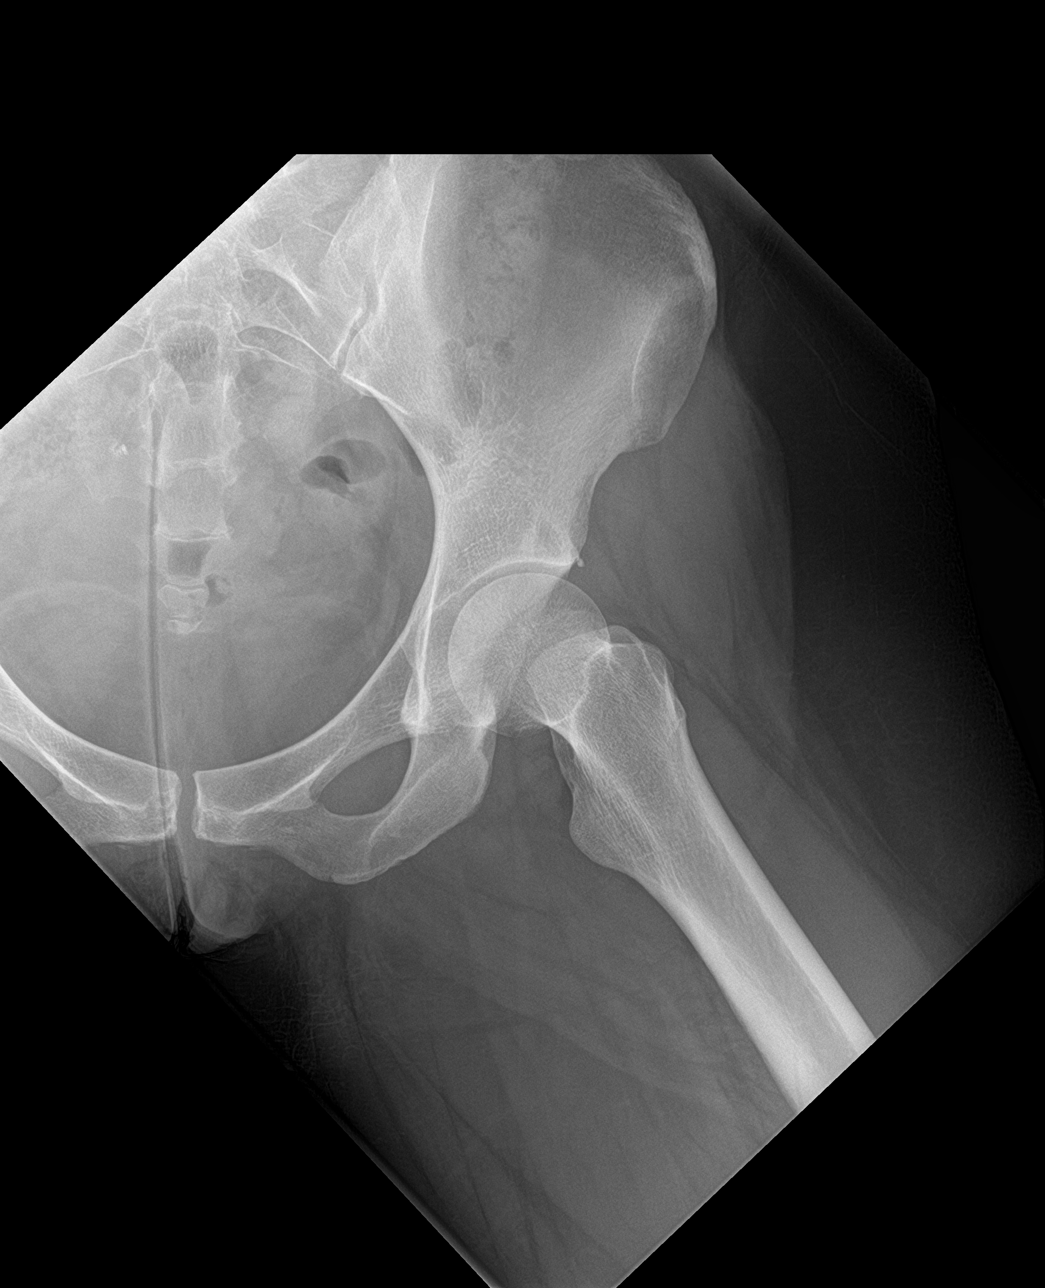

[3 of 3 positions shown; findings below may reference images not displayed]

FINDINGS: Normal appearing hips, pelvis and lower lumbar spine. Small coarse
calcification in the right pelvis.
IMPRESSION: Probable small calcified uterine fibroid in the pelvis on the right.
Otherwise, normal examination.

## 2019-04-18 MED FILL — NORETHIND-ETH ESTRAD 1-0.02: 1-20 | 84 days supply | Qty: 63 | Fill #1
